# Patient Record
Sex: Male | Born: 1985 | Race: White | Hispanic: No | Marital: Single | Smoking: Current some day smoker
Health system: Southern US, Community
[De-identification: ages and names within clinical notes are randomized; demographics above are authoritative.]

## PROBLEM LIST (undated history)

## (undated) DIAGNOSIS — F410 Panic disorder [episodic paroxysmal anxiety] without agoraphobia: Secondary | ICD-10-CM

## (undated) DIAGNOSIS — F419 Anxiety disorder, unspecified: Secondary | ICD-10-CM

## (undated) DIAGNOSIS — F1010 Alcohol abuse, uncomplicated: Secondary | ICD-10-CM

## (undated) DIAGNOSIS — R569 Unspecified convulsions: Secondary | ICD-10-CM

---

## 2004-11-28 ENCOUNTER — Emergency Department: Payer: Self-pay | Admitting: Emergency Medicine

## 2005-04-29 ENCOUNTER — Other Ambulatory Visit: Payer: Self-pay

## 2005-04-29 ENCOUNTER — Emergency Department: Payer: Self-pay | Admitting: Emergency Medicine

## 2006-06-25 ENCOUNTER — Emergency Department: Payer: Self-pay | Admitting: Emergency Medicine

## 2008-08-26 ENCOUNTER — Emergency Department: Payer: Self-pay

## 2008-08-27 ENCOUNTER — Emergency Department: Payer: Self-pay | Admitting: Emergency Medicine

## 2009-04-22 ENCOUNTER — Emergency Department: Payer: Self-pay | Admitting: Emergency Medicine

## 2010-08-01 ENCOUNTER — Inpatient Hospital Stay: Payer: Self-pay | Admitting: Psychiatry

## 2011-02-20 ENCOUNTER — Emergency Department: Payer: Self-pay | Admitting: *Deleted

## 2011-10-23 ENCOUNTER — Emergency Department: Payer: Self-pay | Admitting: Emergency Medicine

## 2011-10-23 LAB — URINALYSIS, COMPLETE
Bacteria: NONE SEEN
Bilirubin,UR: NEGATIVE
Glucose,UR: NEGATIVE mg/dL (ref 0–75)
Leukocyte Esterase: NEGATIVE
Nitrite: NEGATIVE
Ph: 5 (ref 4.5–8.0)
Protein: NEGATIVE
RBC,UR: 6 /HPF (ref 0–5)
Squamous Epithelial: NONE SEEN

## 2011-10-23 LAB — COMPREHENSIVE METABOLIC PANEL
Albumin: 4.8 g/dL (ref 3.4–5.0)
Alkaline Phosphatase: 76 U/L (ref 50–136)
BUN: 11 mg/dL (ref 7–18)
Bilirubin,Total: 0.6 mg/dL (ref 0.2–1.0)
Co2: 33 mmol/L — ABNORMAL HIGH (ref 21–32)
EGFR (Non-African Amer.): >60
Osmolality: 279 (ref 275–301)
Potassium: 4.1 mmol/L (ref 3.5–5.1)
SGOT(AST): 28 U/L (ref 15–37)
Sodium: 140 mmol/L (ref 136–145)

## 2011-10-23 LAB — CBC
HCT: 50.2 % (ref 40.0–52.0)
HGB: 17.1 g/dL (ref 13.0–18.0)
MCHC: 34 g/dL (ref 32.0–36.0)
Platelet: 355 10*3/uL (ref 150–440)
RBC: 5.23 10*6/uL (ref 4.40–5.90)
WBC: 4.6 10*3/uL (ref 3.8–10.6)

## 2011-10-23 LAB — DRUG SCREEN, URINE
Amphetamines, Ur Screen: NEGATIVE (ref ?–1000)
Benzodiazepine, Ur Scrn: POSITIVE (ref ?–200)
MDMA (Ecstasy)Ur Screen: NEGATIVE (ref ?–500)
Methadone, Ur Screen: NEGATIVE (ref ?–300)
Opiate, Ur Screen: NEGATIVE (ref ?–300)
Tricyclic, Ur Screen: NEGATIVE (ref ?–1000)

## 2011-10-23 LAB — SALICYLATE LEVEL: Salicylates, Serum: <1.7 mg/dL

## 2011-10-23 LAB — TSH: Thyroid Stimulating Horm: 5.22 u[IU]/mL — ABNORMAL HIGH

## 2011-10-23 LAB — CK TOTAL AND CKMB (NOT AT ARMC)
CK, Total: 92 U/L (ref 35–232)
CK-MB: <0.5 ng/mL — ABNORMAL LOW (ref 0.5–3.6)

## 2011-10-23 LAB — TROPONIN I
Troponin-I: <0.02 ng/mL
Troponin-I: <0.02 ng/mL

## 2011-10-23 LAB — ETHANOL
Ethanol %: 0.251 % — ABNORMAL HIGH (ref 0.000–0.080)
Ethanol: 251 mg/dL

## 2011-12-24 ENCOUNTER — Inpatient Hospital Stay: Payer: Self-pay | Admitting: Psychiatry

## 2011-12-24 LAB — URINALYSIS, COMPLETE
Bacteria: NONE SEEN
Blood: NEGATIVE
Ketone: NEGATIVE
Leukocyte Esterase: NEGATIVE
Nitrite: NEGATIVE
Ph: 6 (ref 4.5–8.0)
Protein: NEGATIVE
Specific Gravity: 1.003 (ref 1.003–1.030)
WBC UR: NONE SEEN /HPF (ref 0–5)

## 2011-12-24 LAB — COMPREHENSIVE METABOLIC PANEL
Albumin: 4.7 g/dL (ref 3.4–5.0)
Alkaline Phosphatase: 86 U/L (ref 50–136)
Anion Gap: 10 (ref 7–16)
BUN: 6 mg/dL — ABNORMAL LOW (ref 7–18)
Co2: 26 mmol/L (ref 21–32)
Creatinine: 0.7 mg/dL (ref 0.60–1.30)
EGFR (Non-African Amer.): >60
Osmolality: 280 (ref 275–301)
Potassium: 3.7 mmol/L (ref 3.5–5.1)
SGOT(AST): 42 U/L — ABNORMAL HIGH (ref 15–37)
SGPT (ALT): 21 U/L
Total Protein: 8.6 g/dL — ABNORMAL HIGH (ref 6.4–8.2)

## 2011-12-24 LAB — DRUG SCREEN, URINE
Barbiturates, Ur Screen: NEGATIVE (ref ?–200)
Benzodiazepine, Ur Scrn: NEGATIVE (ref ?–200)
Cannabinoid 50 Ng, Ur ~~LOC~~: NEGATIVE (ref ?–50)
Cocaine Metabolite,Ur ~~LOC~~: NEGATIVE (ref ?–300)
Methadone, Ur Screen: NEGATIVE (ref ?–300)
Opiate, Ur Screen: NEGATIVE (ref ?–300)
Phencyclidine (PCP) Ur S: NEGATIVE (ref ?–25)
Tricyclic, Ur Screen: NEGATIVE (ref ?–1000)

## 2011-12-24 LAB — TSH: Thyroid Stimulating Horm: 2.51 u[IU]/mL

## 2011-12-24 LAB — CBC
MCH: 33.2 pg (ref 26.0–34.0)
MCHC: 35 g/dL (ref 32.0–36.0)
Platelet: 307 10*3/uL (ref 150–440)
RBC: 4.81 10*6/uL (ref 4.40–5.90)
WBC: 4.4 10*3/uL (ref 3.8–10.6)

## 2011-12-28 LAB — COMPREHENSIVE METABOLIC PANEL
Albumin: 4.3 g/dL (ref 3.4–5.0)
Anion Gap: 7 (ref 7–16)
BUN: 13 mg/dL (ref 7–18)
Bilirubin,Total: 1.4 mg/dL — ABNORMAL HIGH (ref 0.2–1.0)
Calcium, Total: 9.4 mg/dL (ref 8.5–10.1)
Co2: 30 mmol/L (ref 21–32)
EGFR (African American): >60
SGPT (ALT): 16 U/L (ref 12–78)
Sodium: 136 mmol/L (ref 136–145)
Total Protein: 8.1 g/dL (ref 6.4–8.2)

## 2012-03-08 ENCOUNTER — Emergency Department: Payer: Self-pay | Admitting: Emergency Medicine

## 2012-03-08 LAB — COMPREHENSIVE METABOLIC PANEL
Albumin: 4.2 g/dL (ref 3.4–5.0)
BUN: 10 mg/dL (ref 7–18)
Bilirubin,Total: 1 mg/dL (ref 0.2–1.0)
Chloride: 89 mmol/L — ABNORMAL LOW (ref 98–107)
Creatinine: 0.7 mg/dL (ref 0.60–1.30)
EGFR (African American): >60
Glucose: 127 mg/dL — ABNORMAL HIGH (ref 65–99)
Osmolality: 263 (ref 275–301)
Potassium: 2.7 mmol/L — ABNORMAL LOW (ref 3.5–5.1)
SGOT(AST): 38 U/L — ABNORMAL HIGH (ref 15–37)
SGPT (ALT): 25 U/L (ref 12–78)
Total Protein: 8.4 g/dL — ABNORMAL HIGH (ref 6.4–8.2)

## 2012-03-08 LAB — URINALYSIS, COMPLETE
Bilirubin,UR: NEGATIVE
Blood: NEGATIVE
Glucose,UR: NEGATIVE mg/dL (ref 0–75)
Hyaline Cast: 3
Ketone: NEGATIVE
Nitrite: NEGATIVE
Ph: 5 (ref 4.5–8.0)
Specific Gravity: 1.03 (ref 1.003–1.030)
Squamous Epithelial: NONE SEEN
WBC UR: 6 /HPF (ref 0–5)

## 2012-03-08 LAB — CBC
HGB: 14.1 g/dL (ref 13.0–18.0)
MCHC: 35.1 g/dL (ref 32.0–36.0)
MCV: 93 fL (ref 80–100)
RDW: 12.1 % (ref 11.5–14.5)
WBC: 8.4 10*3/uL (ref 3.8–10.6)

## 2012-06-07 ENCOUNTER — Emergency Department: Payer: Self-pay | Admitting: Emergency Medicine

## 2012-06-09 LAB — BETA STREP CULTURE(ARMC)

## 2012-11-17 ENCOUNTER — Emergency Department: Payer: Self-pay | Admitting: Emergency Medicine

## 2012-11-17 LAB — COMPREHENSIVE METABOLIC PANEL
Albumin: 5 g/dL (ref 3.4–5.0)
Calcium, Total: 9.2 mg/dL (ref 8.5–10.1)
Chloride: 102 mmol/L (ref 98–107)
EGFR (Non-African Amer.): >60
Glucose: 97 mg/dL (ref 65–99)
Osmolality: 277 (ref 275–301)
SGOT(AST): 38 U/L — ABNORMAL HIGH (ref 15–37)
SGPT (ALT): 25 U/L (ref 12–78)
Sodium: 140 mmol/L (ref 136–145)
Total Protein: 9.3 g/dL — ABNORMAL HIGH (ref 6.4–8.2)

## 2012-11-17 LAB — CBC
HGB: 16 g/dL (ref 13.0–18.0)
MCH: 33.1 pg (ref 26.0–34.0)
MCV: 97 fL (ref 80–100)
Platelet: 396 10*3/uL (ref 150–440)
RBC: 4.83 10*6/uL (ref 4.40–5.90)
WBC: 5.7 10*3/uL (ref 3.8–10.6)

## 2012-11-17 LAB — DRUG SCREEN, URINE
Amphetamines, Ur Screen: NEGATIVE (ref ?–1000)
Benzodiazepine, Ur Scrn: NEGATIVE (ref ?–200)
Cannabinoid 50 Ng, Ur ~~LOC~~: NEGATIVE (ref ?–50)
Cocaine Metabolite,Ur ~~LOC~~: NEGATIVE (ref ?–300)
Opiate, Ur Screen: NEGATIVE (ref ?–300)
Phencyclidine (PCP) Ur S: NEGATIVE (ref ?–25)
Tricyclic, Ur Screen: NEGATIVE (ref ?–1000)

## 2012-11-17 LAB — SALICYLATE LEVEL: Salicylates, Serum: <1.7 mg/dL

## 2013-01-20 ENCOUNTER — Emergency Department: Payer: Self-pay | Admitting: Emergency Medicine

## 2013-01-20 LAB — URINALYSIS, COMPLETE
Bacteria: NONE SEEN
Bilirubin,UR: NEGATIVE
Glucose,UR: NEGATIVE mg/dL (ref 0–75)
Ketone: NEGATIVE
Leukocyte Esterase: NEGATIVE
Nitrite: NEGATIVE
Ph: 5 (ref 4.5–8.0)
Protein: NEGATIVE
Specific Gravity: 1.006 (ref 1.003–1.030)
WBC UR: NONE SEEN /HPF (ref 0–5)

## 2013-01-20 LAB — COMPREHENSIVE METABOLIC PANEL
Albumin: 4.5 g/dL (ref 3.4–5.0)
BUN: 7 mg/dL (ref 7–18)
Bilirubin,Total: 0.2 mg/dL (ref 0.2–1.0)
Chloride: 110 mmol/L — ABNORMAL HIGH (ref 98–107)
Co2: 26 mmol/L (ref 21–32)
EGFR (African American): >60
Glucose: 97 mg/dL (ref 65–99)
Osmolality: 283 (ref 275–301)
Sodium: 143 mmol/L (ref 136–145)

## 2013-01-20 LAB — DRUG SCREEN, URINE
Amphetamines, Ur Screen: NEGATIVE (ref ?–1000)
Barbiturates, Ur Screen: NEGATIVE (ref ?–200)
Benzodiazepine, Ur Scrn: NEGATIVE (ref ?–200)
Cannabinoid 50 Ng, Ur ~~LOC~~: NEGATIVE (ref ?–50)
Methadone, Ur Screen: NEGATIVE (ref ?–300)
Tricyclic, Ur Screen: NEGATIVE (ref ?–1000)

## 2013-01-20 LAB — CBC
HCT: 44.2 % (ref 40.0–52.0)
HGB: 15.3 g/dL (ref 13.0–18.0)
RBC: 4.59 10*6/uL (ref 4.40–5.90)
RDW: 13.3 % (ref 11.5–14.5)
WBC: 6.6 10*3/uL (ref 3.8–10.6)

## 2013-01-20 LAB — ETHANOL: Ethanol %: 0.39 % (ref 0.000–0.080)

## 2013-01-21 LAB — CBC
HGB: 14.4 g/dL (ref 13.0–18.0)
MCHC: 34.7 g/dL (ref 32.0–36.0)
RDW: 13.4 % (ref 11.5–14.5)
WBC: 5.8 10*3/uL (ref 3.8–10.6)

## 2013-01-21 LAB — COMPREHENSIVE METABOLIC PANEL
Alkaline Phosphatase: 67 U/L (ref 50–136)
BUN: 9 mg/dL (ref 7–18)
Bilirubin,Total: 0.3 mg/dL (ref 0.2–1.0)
Co2: 26 mmol/L (ref 21–32)
Creatinine: 0.96 mg/dL (ref 0.60–1.30)
EGFR (African American): >60
Glucose: 95 mg/dL (ref 65–99)
SGOT(AST): 25 U/L (ref 15–37)
SGPT (ALT): 20 U/L (ref 12–78)
Sodium: 142 mmol/L (ref 136–145)
Total Protein: 8.1 g/dL (ref 6.4–8.2)

## 2013-01-21 LAB — TSH: Thyroid Stimulating Horm: 1.39 u[IU]/mL

## 2013-01-21 LAB — ETHANOL: Ethanol: 428 mg/dL

## 2013-01-22 ENCOUNTER — Inpatient Hospital Stay: Payer: Self-pay | Admitting: Psychiatry

## 2013-01-22 LAB — URINALYSIS, COMPLETE
Bacteria: NONE SEEN
Bilirubin,UR: NEGATIVE
Glucose,UR: NEGATIVE mg/dL (ref 0–75)
Ketone: NEGATIVE
Leukocyte Esterase: NEGATIVE
Ph: 5 (ref 4.5–8.0)
RBC,UR: NONE SEEN /HPF (ref 0–5)
Squamous Epithelial: NONE SEEN
WBC UR: 1 /HPF (ref 0–5)

## 2013-01-22 LAB — DRUG SCREEN, URINE
Amphetamines, Ur Screen: NEGATIVE (ref ?–1000)
Barbiturates, Ur Screen: NEGATIVE (ref ?–200)
Benzodiazepine, Ur Scrn: NEGATIVE (ref ?–200)
Cannabinoid 50 Ng, Ur ~~LOC~~: NEGATIVE (ref ?–50)
MDMA (Ecstasy)Ur Screen: NEGATIVE (ref ?–500)
Methadone, Ur Screen: NEGATIVE (ref ?–300)
Opiate, Ur Screen: NEGATIVE (ref ?–300)
Phencyclidine (PCP) Ur S: NEGATIVE (ref ?–25)
Tricyclic, Ur Screen: NEGATIVE (ref ?–1000)

## 2013-01-25 ENCOUNTER — Emergency Department: Payer: Self-pay | Admitting: Emergency Medicine

## 2013-01-25 LAB — COMPREHENSIVE METABOLIC PANEL
Albumin: 4.7 g/dL (ref 3.4–5.0)
Alkaline Phosphatase: 62 U/L (ref 50–136)
Anion Gap: 5 — ABNORMAL LOW (ref 7–16)
Bilirubin,Total: 0.5 mg/dL (ref 0.2–1.0)
Co2: 28 mmol/L (ref 21–32)
Creatinine: 0.73 mg/dL (ref 0.60–1.30)
EGFR (African American): >60
Glucose: 82 mg/dL (ref 65–99)
Osmolality: 265 (ref 275–301)
Potassium: 4.3 mmol/L (ref 3.5–5.1)
SGOT(AST): 24 U/L (ref 15–37)
Total Protein: 8.2 g/dL (ref 6.4–8.2)

## 2013-01-25 LAB — URINALYSIS, COMPLETE
Bilirubin,UR: NEGATIVE
Blood: NEGATIVE
Glucose,UR: NEGATIVE mg/dL (ref 0–75)
Ketone: NEGATIVE
Leukocyte Esterase: NEGATIVE
Nitrite: NEGATIVE
Ph: 6 (ref 4.5–8.0)
Protein: NEGATIVE
Squamous Epithelial: NONE SEEN
WBC UR: NONE SEEN /HPF (ref 0–5)

## 2013-01-25 LAB — DRUG SCREEN, URINE
Amphetamines, Ur Screen: NEGATIVE (ref ?–1000)
Barbiturates, Ur Screen: NEGATIVE (ref ?–200)
Benzodiazepine, Ur Scrn: NEGATIVE (ref ?–200)
Cannabinoid 50 Ng, Ur ~~LOC~~: NEGATIVE (ref ?–50)
Cocaine Metabolite,Ur ~~LOC~~: NEGATIVE (ref ?–300)
Methadone, Ur Screen: NEGATIVE (ref ?–300)
Opiate, Ur Screen: NEGATIVE (ref ?–300)
Phencyclidine (PCP) Ur S: NEGATIVE (ref ?–25)

## 2013-01-25 LAB — CBC
HCT: 42.1 % (ref 40.0–52.0)
MCHC: 34.7 g/dL (ref 32.0–36.0)
MCV: 97 fL (ref 80–100)
Platelet: 216 10*3/uL (ref 150–440)

## 2013-01-25 LAB — ETHANOL
Ethanol %: <0.003 % (ref 0.000–0.080)
Ethanol: <3 mg/dL

## 2013-01-29 ENCOUNTER — Emergency Department: Payer: Self-pay | Admitting: Emergency Medicine

## 2013-11-19 ENCOUNTER — Emergency Department: Payer: Self-pay | Admitting: Emergency Medicine

## 2013-11-19 LAB — URINALYSIS, COMPLETE
BACTERIA: NONE SEEN
Bilirubin,UR: NEGATIVE
Blood: NEGATIVE
GLUCOSE, UR: NEGATIVE mg/dL (ref 0–75)
Ketone: NEGATIVE
LEUKOCYTE ESTERASE: NEGATIVE
Nitrite: NEGATIVE
PH: 6 (ref 4.5–8.0)
PROTEIN: NEGATIVE
RBC, UR: NONE SEEN /HPF (ref 0–5)
SPECIFIC GRAVITY: 1.014 (ref 1.003–1.030)
SQUAMOUS EPITHELIAL: NONE SEEN
WBC UR: 1 /HPF (ref 0–5)

## 2013-11-19 LAB — CBC
HCT: 44.2 % (ref 40.0–52.0)
HGB: 14.9 g/dL (ref 13.0–18.0)
MCH: 33.1 pg (ref 26.0–34.0)
MCHC: 33.7 g/dL (ref 32.0–36.0)
MCV: 98 fL (ref 80–100)
PLATELETS: 157 10*3/uL (ref 150–440)
RBC: 4.5 10*6/uL (ref 4.40–5.90)
RDW: 13.7 % (ref 11.5–14.5)
WBC: 6.2 10*3/uL (ref 3.8–10.6)

## 2013-11-19 LAB — COMPREHENSIVE METABOLIC PANEL
ALK PHOS: 59 U/L
ANION GAP: 10 (ref 7–16)
AST: 48 U/L — AB (ref 15–37)
Albumin: 4.6 g/dL (ref 3.4–5.0)
BUN: 5 mg/dL — ABNORMAL LOW (ref 7–18)
Bilirubin,Total: 0.5 mg/dL (ref 0.2–1.0)
CALCIUM: 9.8 mg/dL (ref 8.5–10.1)
CHLORIDE: 99 mmol/L (ref 98–107)
CO2: 29 mmol/L (ref 21–32)
CREATININE: 0.68 mg/dL (ref 0.60–1.30)
EGFR (African American): >60
GLUCOSE: 108 mg/dL — AB (ref 65–99)
OSMOLALITY: 273 (ref 275–301)
POTASSIUM: 3.8 mmol/L (ref 3.5–5.1)
SGPT (ALT): 43 U/L (ref 12–78)
SODIUM: 138 mmol/L (ref 136–145)
Total Protein: 8.2 g/dL (ref 6.4–8.2)

## 2013-11-19 LAB — ACETAMINOPHEN LEVEL

## 2013-11-19 LAB — DRUG SCREEN, URINE

## 2013-11-19 LAB — SALICYLATE LEVEL: Salicylates, Serum: <1.7 mg/dL

## 2013-11-19 LAB — ETHANOL: Ethanol %: <0.003 % (ref 0.000–0.080)

## 2013-11-19 LAB — TSH: Thyroid Stimulating Horm: 2.59 u[IU]/mL

## 2014-08-22 ENCOUNTER — Emergency Department
Admit: 2014-08-22 | Disposition: A | Discharge status: Home / Self Care | Payer: Self-pay | Admitting: Emergency Medicine

## 2014-08-23 ENCOUNTER — Emergency Department
Admit: 2014-08-23 | Disposition: A | Discharge status: Home / Self Care | Payer: Self-pay | Admitting: Emergency Medicine

## 2014-08-23 LAB — CBC WITH DIFFERENTIAL/PLATELET
BASOS ABS: 0 10*3/uL (ref 0.0–0.1)
Basophil %: 0.6 %
Eosinophil #: 0.1 10*3/uL (ref 0.0–0.7)
Eosinophil %: 1 %
HCT: 43.8 % (ref 40.0–52.0)
HGB: 14.8 g/dL (ref 13.0–18.0)
LYMPHS ABS: 1 10*3/uL (ref 1.0–3.6)
Lymphocyte %: 13.1 %
MCH: 33.2 pg (ref 26.0–34.0)
MCHC: 33.7 g/dL (ref 32.0–36.0)
MCV: 99 fL (ref 80–100)
MONO ABS: 1 x10 3/mm (ref 0.2–1.0)
MONOS PCT: 13.7 %
NEUTROS ABS: 5.4 10*3/uL (ref 1.4–6.5)
Neutrophil %: 71.6 %
PLATELETS: 258 10*3/uL (ref 150–440)
RBC: 4.45 10*6/uL (ref 4.40–5.90)
RDW: 12.8 % (ref 11.5–14.5)
WBC: 7.5 10*3/uL (ref 3.8–10.6)
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
## 2014-09-13 NOTE — Consult Note (Signed)
Consult: Substance Abuse treatment recommendations  Met with patient as requested and explored with him his expressed desire of treatment in a treatment Center in [facility], [place] at this discharge. He was able to disclose events that led to his IVC and the need for treatment at this level of care to include past history of relapse, conflict between his girlfriend and him as well as his medical necessity due to his consuming alcoholic beverage in addition to his demonstrating resistance to treatment which required the IVC due to the need of detox thus this level of Care, however he also indicated that if his desired treatment in [facility] was a long term program he does not want to go. Patient may benefit from treatment at a [place] however; it is possible that he may not go on his own volition. He expressed desires for treatment however he also expressed desires of not going to treatment. Findings were reviewed with patient as well as attending psychiatrist.   Electronic Signatures: Laqueta Due (PsyD)  (Signed on 02-Sep-14 15:28)  Authored  Last Updated: 02-Sep-14 15:28 by Laqueta Due (PsyD)

## 2014-09-13 NOTE — Consult Note (Signed)
Brief Consult Note: Diagnosis: Alcohol dependence.   Patient was seen by consultant.   Consult note dictated.   Recommend further assessment or treatment.   Orders entered.   Comments: Will admit to BMU.  Electronic Signatures: Kristine LineaPucilowska, Catharina Pica (MD)  (Signed 01-Sep-14 13:53)  Authored: Brief Consult Note   Last Updated: 01-Sep-14 13:53 by Kristine LineaPucilowska, Lisset Ketchem (MD)

## 2014-10-01 NOTE — H&P (Signed)
PATIENT NAME:  Micheal Micheal [name], Micheal Micheal [name] [id number] OF BIRTH:  01/11/1986 OF ADMISSION:  12/22/2012  REFERRING PHYSICIAN: Emergency Room MD PHYSICIAN: Kristine LineaJolanta Pucilowska, MD   DATA: Micheal Micheal [name] is Micheal [name] 29 year old male with history of alcoholism.   COMPLAINT: "I need treatment."   OF PRESENT ILLNESS: Micheal Micheal [name] has Micheal [name] long history of alcoholism. He was admitted to [facility] exactly 1 year ago for detox. At that time, he was not interested in alcohol rehab and was discharged to IOP program. He did not follow up. He did not take prescribed medications or maintain sobriety. He has been drinking heavily for the past several months. He started stealing from his family. His girlfriend kicked him out of the house. He has been to the ER on 27th and 30th of August but left AMA. He returns again on August 31 asking for detox and long term treatment at [facility]. He refuses to go to [facility] but they do not have any beds available. He has been consuming 3 40 oz beers and Micheal Micheal [name] daily to the BAL of 428. The patient denies suicidal ideation. He does not have Micheal [name] history of violence. He denies psychotic symptoms or symptoms suggestive of bipolar mania. There are no other than alcohol substance use.   PSYCHIATRIC HISTORY: He was hospitalized at [facility] in March of 2012 and in August of 2013 for treatment of bipolar disorder and alcohol dependence. There is one suicide attempt/gesture with Micheal [name] gun while drunk. He made suicidal threats in the past. In spite of diagnosis of bipolar illness, the patient has not been compliant with offered medications. PSYCHIATRIC HISTORY: None reported.  MEDICAL HISTORY: History of alcohol withdrawal seizures   ALLERGIES: No known drug allergies.  ON ADMISSION: None.  HISTORY: [place] was born [place], Dropped out in the 9th grade to go to work. He has never been married and has no children. He used to live with Micheal [name] steady girlfriend and her child. She will not have him  anymore. He has not been employed for many years now. He has no health insurance.   REVIEW OF SYSTEMS:  CONSTITUTIONAL: No fevers or chills. Positive for some weight loss.  No double or blurred vision. No hearing loss. No shortness of breath or cough. No chest pain or orthopnea. No abdominal pain, nausea, vomiting or diarrhea. No incontinence or frequency. No heat or cold intolerance. No anemia or easy bruising. No acne or rash. No muscle or joint pain. No tingling or weakness. See history of present illness for details.  EXAMINATION: VITAL SIGNS: Blood pressure 136/84, pulse 82, respirations 20, temperature 98.2. GENERAL: This is Micheal [name] slender man in no acute distress. HEENT: The pupils are equal, round, and reactive to light. Sclerae anicteric. NECK: Supple. No thyromegaly. LUNGS: Clear to auscultation. No dullness to percussion. HEART: Regular rhythm and rate. No murmurs, rubs, or gallops. ABDOMEN: Soft, nontender, nondistended. Positive bowel sounds. MUSCULOSKELETAL: Normal muscle strength in all extremities. SKIN: No rashes or bruises. LYMPHATIC: No cervical adenopathy. NEUROLOGIC: Cranial nerves II through XII are intact.  DATA: Chemistries are within normal limits except for blood alcohol level of 0.428. LFTs within normal limits. TSH 2.39. Urine tox screen negative for substances. CBC within normal limits. Urinalysis not suggestive of urinary tract infection.   MENTAL STATUS EXAMINATION ON ADMISSION: The patient is alert and fully oriented. He is pleasant, polite and cooperative. He is well groomed, wearing hospital scrubs. She maintains some eye contact. There is poverty of speech. Mood  is "bad" with anxious affect. Thought process is logical. He denies suicidal or homicidal ideation. There are no delusions, paranoia or hallucinations. His cognition is grossly intact. His insight and judgment are questionable.  SUICIDE RISK ASSESSMENT ON ADMISSION: This is Micheal [name] patient with Micheal [name] long history of mood  instability, depression, and alcoholism who is drinking out of control, does not take medications and has severe stressors. He is at increased risk for suicide.   DIAGNOSES: I:  Alcohol dependence.    Bipolar affective disorder per history. II:  Personality disorder, not otherwise specified. III:  History of alcohol withdrawal seizures. IV:  Mental illness, substance abuse, occupational, financial, primary support, relationship. V:  GAF on admission 25.  The patient was admitted to [facility] for safety, stabilization, and medication management. He was initially placed on suicide precautions and was closely monitored for any unsafe behaviors. He underwent full psychiatric and risk assessment. He received pharmacotherapy, individual and group psychotherapy, substance abuse counseling, and support from therapeutic milieu.  1. Alcohol detox: The patient is on CIWA protocol.    2. Mood. In the past, the patient did well on Micheal [name] combination of Celexa and Depakote. He is not interested on pharmacotherapy.  He only wants clonazpam.  Alcohol dependence. He is interested in long term program.    Disposition. TBE.    Electronic Signatures: Kristine LineaPucilowska, Jolanta (MD)  (Signed on 01-Sep-14 18:34)  Authored  Last Updated: 01-Sep-14 18:34 by Kristine LineaPucilowska, Jolanta (MD)

## 2015-07-15 ENCOUNTER — Emergency Department
Admission: EM | Admit: 2015-07-15 | Discharge: 2015-07-15 | Disposition: A | Discharge status: Home / Self Care | Payer: Self-pay | Attending: Emergency Medicine | Admitting: Emergency Medicine

## 2015-07-15 ENCOUNTER — Encounter: Payer: Self-pay | Admitting: Emergency Medicine

## 2015-07-15 DIAGNOSIS — F17210 Nicotine dependence, cigarettes, uncomplicated: Secondary | ICD-10-CM | POA: Insufficient documentation

## 2015-07-15 DIAGNOSIS — R1110 Vomiting, unspecified: Secondary | ICD-10-CM | POA: Insufficient documentation

## 2015-07-15 DIAGNOSIS — R109 Unspecified abdominal pain: Secondary | ICD-10-CM | POA: Insufficient documentation

## 2015-07-15 HISTORY — DX: Panic disorder (episodic paroxysmal anxiety): F41.0

## 2015-07-15 HISTORY — DX: Unspecified convulsions: R56.9

## 2015-07-15 HISTORY — DX: Alcohol abuse, uncomplicated: F10.10

## 2015-07-15 HISTORY — DX: Anxiety disorder, unspecified: F41.9

## 2015-07-15 NOTE — ED Notes (Signed)
Consumed an excessive amount of liquor a week ago, a lot of vomiting since then and is now c/o severe pain with swallowing to the point that it is causing difficulty with eating and states he has lost 15 pounds.  Pain is worse at the top of his throat and radiates to his stomach, especially when eating or drinking.  States he has been using a lot of BC powders over the past week, around 10 a day.  Also using chloraseptic spray a lot.

## 2015-07-27 ENCOUNTER — Encounter: Payer: Self-pay | Admitting: Emergency Medicine

## 2015-07-27 ENCOUNTER — Emergency Department
Admission: EM | Admit: 2015-07-27 | Discharge: 2015-07-28 | Disposition: A | Discharge status: Home / Self Care | Payer: Self-pay | Attending: Emergency Medicine | Admitting: Emergency Medicine

## 2015-07-27 DIAGNOSIS — F1210 Cannabis abuse, uncomplicated: Secondary | ICD-10-CM | POA: Insufficient documentation

## 2015-07-27 DIAGNOSIS — F410 Panic disorder [episodic paroxysmal anxiety] without agoraphobia: Secondary | ICD-10-CM | POA: Insufficient documentation

## 2015-07-27 DIAGNOSIS — F17210 Nicotine dependence, cigarettes, uncomplicated: Secondary | ICD-10-CM | POA: Insufficient documentation

## 2015-07-27 DIAGNOSIS — R000 Tachycardia, unspecified: Secondary | ICD-10-CM | POA: Insufficient documentation

## 2015-07-27 DIAGNOSIS — F1010 Alcohol abuse, uncomplicated: Secondary | ICD-10-CM | POA: Insufficient documentation

## 2015-07-27 LAB — COMPREHENSIVE METABOLIC PANEL
ALBUMIN: 5 g/dL (ref 3.5–5.0)
ALK PHOS: 64 U/L (ref 38–126)
ALT: 23 U/L (ref 17–63)
ANION GAP: 11 (ref 5–15)
AST: 41 U/L (ref 15–41)
BILIRUBIN TOTAL: 0.6 mg/dL (ref 0.3–1.2)
BUN: 7 mg/dL (ref 6–20)
CALCIUM: 9.3 mg/dL (ref 8.9–10.3)
CO2: 33 mmol/L — ABNORMAL HIGH (ref 22–32)
Chloride: 100 mmol/L — ABNORMAL LOW (ref 101–111)
Creatinine, Ser: 0.67 mg/dL (ref 0.61–1.24)
GFR calc non Af Amer: >60 mL/min (ref >60)
GLUCOSE: 108 mg/dL — AB (ref 65–99)
POTASSIUM: 3.3 mmol/L — AB (ref 3.5–5.1)
Sodium: 144 mmol/L (ref 135–145)
TOTAL PROTEIN: 8.3 g/dL — AB (ref 6.5–8.1)

## 2015-07-27 LAB — CBC
HEMATOCRIT: 44.2 % (ref 40.0–52.0)
HEMOGLOBIN: 15 g/dL (ref 13.0–18.0)
MCH: 31.4 pg (ref 26.0–34.0)
MCHC: 34 g/dL (ref 32.0–36.0)
MCV: 92.3 fL (ref 80.0–100.0)
Platelets: 349 10*3/uL (ref 150–440)
RBC: 4.79 MIL/uL (ref 4.40–5.90)
RDW: 14.5 % (ref 11.5–14.5)
WBC: 5.2 10*3/uL (ref 3.8–10.6)

## 2015-07-27 LAB — URINE DRUG SCREEN, QUALITATIVE (ARMC ONLY)
AMPHETAMINES, UR SCREEN: NOT DETECTED
BARBITURATES, UR SCREEN: NOT DETECTED
BENZODIAZEPINE, UR SCRN: NOT DETECTED
Cannabinoid 50 Ng, Ur ~~LOC~~: POSITIVE — AB
Cocaine Metabolite,Ur ~~LOC~~: NOT DETECTED
MDMA (Ecstasy)Ur Screen: NOT DETECTED
METHADONE SCREEN, URINE: NOT DETECTED
OPIATE, UR SCREEN: NOT DETECTED
Phencyclidine (PCP) Ur S: NOT DETECTED
TRICYCLIC, UR SCREEN: NOT DETECTED

## 2015-07-27 LAB — ETHANOL: ALCOHOL ETHYL (B): 434 mg/dL — AB (ref <5)

## 2015-07-27 MED ORDER — SODIUM CHLORIDE 0.9 % IV BOLUS (SEPSIS)
1000.0000 mL | Freq: Once | INTRAVENOUS | Status: AC
  Administered 2015-07-27: 1000 mL via INTRAVENOUS

## 2015-07-27 MED ORDER — THIAMINE HCL 100 MG/ML IJ SOLN
Freq: Once | INTRAVENOUS | Status: AC
  Administered 2015-07-28: via INTRAVENOUS
  Filled 2015-07-27: qty 1000

## 2015-07-27 MED ORDER — LORAZEPAM 2 MG/ML IJ SOLN
0.0000 mg | Freq: Four times a day (QID) | INTRAMUSCULAR | Status: DC
  Administered 2015-07-28: 2 mg via INTRAVENOUS

## 2015-07-27 MED ORDER — THIAMINE HCL 100 MG/ML IJ SOLN: 100.0000 mg | Freq: Every day | INTRAMUSCULAR | Status: DC

## 2015-07-27 MED ORDER — LORAZEPAM 2 MG PO TABS
0.0000 mg | ORAL_TABLET | Freq: Four times a day (QID) | ORAL | Status: DC
  Administered 2015-07-28: 2 mg via ORAL
  Filled 2015-07-27: qty 1

## 2015-07-27 MED ORDER — LORAZEPAM 2 MG/ML IJ SOLN
INTRAMUSCULAR | Status: AC
  Administered 2015-07-27: 2 mg via INTRAVENOUS
  Filled 2015-07-27: qty 1

## 2015-07-27 MED ORDER — LORAZEPAM 2 MG/ML IJ SOLN: 0.0000 mg | Freq: Two times a day (BID) | INTRAMUSCULAR | Status: DC

## 2015-07-27 MED ORDER — VITAMIN B-1 100 MG PO TABS: 100.0000 mg | ORAL_TABLET | Freq: Every day | ORAL | Status: DC

## 2015-07-27 MED ORDER — LORAZEPAM 2 MG/ML IJ SOLN
2.0000 mg | Freq: Once | INTRAMUSCULAR | Status: AC
  Administered 2015-07-27: 2 mg via INTRAVENOUS

## 2015-07-27 MED ORDER — LORAZEPAM 2 MG PO TABS
0.0000 mg | ORAL_TABLET | Freq: Two times a day (BID) | ORAL | Status: DC
  Administered 2015-07-28: 2 mg via ORAL
  Filled 2015-07-27: qty 1

## 2015-07-27 NOTE — ED Provider Notes (Signed)
[facility] Emergency Department Provider Note  ____________________________________________    I have reviewed the triage vital signs and the nursing notes.   HISTORY  Chief Complaint Alcohol Problem    HPI Micheal [name] is a 30 y.o. male who requests alcohol detox. He states that he is withdrawing. However he reports he recently drank prior to arrival. He notes that he drinks daily typically a half gallon of liquor but apparently over the last 4 days he has been drinking more heavily than usual. He denies drug abuse     Past Medical History  Diagnosis Date  . Seizures (HCC)   . ETOH abuse   . Anxiety   . Panic disorder     There are no active problems to display for this patient.   History reviewed. No pertinent past surgical history.  No current outpatient prescriptions on file.  Allergies Review of patient's allergies indicates no known allergies.  History reviewed. No pertinent family history.  Social History Social History  Substance Use Topics  . Smoking status: Current Some Day Smoker    Types: Cigarettes  . Smokeless tobacco: None  . Alcohol Use: Yes     Comment: intermittent binge drinks, hx of etoh withdraw with seizures    Review of Systems  Constitutional: Negative for dizziness Eyes: Negative for visual changes. ENT: Negative for sore throat Cardiovascular: Negative for chest pain. Positive for palpitations Respiratory: Negative for cough Gastrointestinal: Negative for abdominal pain Genitourinary: Negative for dysuria. Musculoskeletal: Negative for back pain. Skin: Negative for rash. Neurological: Negative for headaches  Psychiatric positive for anxiety    ____________________________________________   PHYSICAL EXAM:  VITAL SIGNS: ED Triage Vitals  Enc Vitals Group     BP 07/27/15 1721 138/91 mmHg     Pulse Rate 07/27/15 1721 135     Resp 07/27/15 1721 20     Temp 07/27/15 1721 97.9 F (36.6 C)      Temp Source 07/27/15 1721 Oral     SpO2 07/27/15 1721 99 %     Weight 07/27/15 1721 150 lb (68.04 kg)     Height 07/27/15 1721 6\' 4"  (1.93 m)     Head Cir --      Peak Flow --      Pain Score --      Pain Loc --      Pain Edu? --      Excl. in GC? --      Constitutional: Alert and oriented. No acute distress, anxious Eyes: Conjunctivae are normal.  ENT   Head: Normocephalic and atraumatic.   Mouth/Throat: Mucous membranes are moist. Cardiovascular: Tachycardic, regular rhythm. Normal and symmetric distal pulses are present in all extremities. Respiratory: Normal respiratory effort without tachypnea nor retractions.  Gastrointestinal: Soft and non-tender in all quadrants. No distention. Genitourinary: deferred Musculoskeletal: Nontender with normal range of motion in all extremities.  Neurologic:  Normal speech and language. No gross focal neurologic deficits are appreciated. Skin:  Skin is warm, dry and intact. No rash noted. Psychiatric: Mood and affect are normal. Patient exhibits appropriate insight and judgment.  ____________________________________________    LABS (pertinent positives/negatives)  Labs Reviewed  COMPREHENSIVE METABOLIC PANEL - Abnormal; Notable for the following:    Potassium 3.3 (*)    Chloride 100 (*)    CO2 33 (*)    Glucose, Bld 108 (*)    Total Protein 8.3 (*)    All other components within normal limits  ETHANOL - Abnormal; Notable for  the following:    Alcohol, Ethyl (B) 434 (*)    All other components within normal limits  URINE DRUG SCREEN, QUALITATIVE ([facility] ONLY) - Abnormal; Notable for the following:    Cannabinoid 50 Ng, Ur [place] POSITIVE (*)    All other components within normal limits  CBC    ____________________________________________   EKG  None  ____________________________________________    RADIOLOGY I have personally reviewed any xrays that were ordered on this  patient: None  ____________________________________________   PROCEDURES  Procedure(s) performed: none  Critical Care performed: none  ____________________________________________   INITIAL IMPRESSION / ASSESSMENT AND PLAN / ED COURSE  Pertinent labs & imaging results that were available during my care of the patient were reviewed by me and considered in my medical decision making (see chart for details).  CIWA protocol started, and TTS consulted for [facility] placement  Patient's alcohol level is too high to go to alcohol detox apparently  ____________________________________________   FINAL CLINICAL IMPRESSION(S) / ED DIAGNOSES  Final diagnoses:  Alcohol abuse     Jene Every, MD 07/27/15 2134

## 2015-07-27 NOTE — ED Notes (Signed)
Pt becoming increasingly agitated stating "I'm going through detox and ya'll ain't doing nothing for it" this tech explained to pt that he would need to wait and consult with a ER doctor before he could be given anything for the agitation. RN (terry) notified

## 2015-07-27 NOTE — ED Notes (Signed)
Pt here for detox from alcohol - He is requesting to be sent to a rehab facility for detox - His last drink was 5 minutes prior to arriving at the ER today

## 2015-07-27 NOTE — ED Notes (Signed)
Pt requesting to leave, Micheal [name], TTS with pt at this time, pt agitated father sent back to waiting room, pt not oriented to procedure

## 2015-07-27 NOTE — BH Assessment (Signed)
20:40-Patient information(Labs, Demographics, Triage Note) faxed to [facility] for possible admission and confirmed it was received ([phone]).  Talked with patient due to wanting to "I go home, take a shower and sleep in my bed..." Writer and his nurse Altamease Oiler(Noelle) informed him the medical risk of leaving due to his BAC being in 400's also discussed with him, he will loose his opportunity for a bed if he was to leave.  Patient agreed to stay.  06:52-Writer faxed updated labs & HP to RTS and confirmed it was received ([phone]). RTS will call back after reviewing it. "It will be after shift change."  Writer updated patient's nurse Altamease Oiler(Noelle).

## 2015-07-27 NOTE — ED Notes (Signed)
Pt would like detox from alcohol.  Reports being alcoholic but has been heavily drinking last 4 days.  Has been drinking half gallon of liquor for past 4 days.  Last drink was 10 min ago.

## 2015-07-27 NOTE — Progress Notes (Signed)
Per Dr. Rise PaganiniKinners' request TTS has consulted with the pt and explored detox and recovery options. Pt has confirmed his interest in being referred to [facility]. TTS has confirmed that there is a bed available. TTS as made pts nurse aware that the pt will need a BMP as well as an H&P. TTS will fax referral information to [facility] when it becomes available.    07/27/2015 Cheryl FlashNicole Colbert Curenton, MS, NCC, LPCA Therapeutic Triage Specialist

## 2015-07-27 NOTE — ED Notes (Signed)
ETOH 434, Paula, Lab, DR MelroseKINNER, orders rx'd

## 2015-07-27 NOTE — ED Notes (Signed)
Pharm called

## 2015-07-28 LAB — ETHANOL: ALCOHOL ETHYL (B): 104 mg/dL — AB (ref <5)

## 2015-07-28 MED ORDER — FAMOTIDINE 20 MG PO TABS
20.0000 mg | ORAL_TABLET | Freq: Two times a day (BID) | ORAL | Status: DC
  Administered 2015-07-28: 20 mg via ORAL
  Filled 2015-07-28: qty 1

## 2015-07-28 MED ORDER — LORAZEPAM 2 MG/ML IJ SOLN
INTRAMUSCULAR | Status: AC
  Administered 2015-07-28: 2 mg via INTRAVENOUS
  Filled 2015-07-28: qty 1

## 2015-07-28 NOTE — ED Notes (Signed)
Calvin, TTS contacted for ETOH results, sending results to [facility]

## 2015-07-28 NOTE — Discharge Instructions (Signed)
Please return to the emergency department if you develop thoughts of hurting yourself or anyone else, hallucinations, seizures, or any other symptoms concerning to you. Alcohol Use Disorder Alcohol use disorder is a mental disorder. It is not a one-time incident of heavy drinking. Alcohol use disorder is the excessive and uncontrollable use of alcohol over time that leads to problems with functioning in one or more areas of daily living. People with this disorder risk harming themselves and others when they drink to excess. Alcohol use disorder also can cause other mental disorders, such as mood and anxiety disorders, and serious physical problems. People with alcohol use disorder often misuse other drugs.  Alcohol use disorder is common and widespread. Some people with this disorder drink alcohol to cope with or escape from negative life events. Others drink to relieve chronic pain or symptoms of mental illness. People with a family history of alcohol use disorder are at higher risk of losing control and using alcohol to excess.  Drinking too much alcohol can cause injury, accidents, and health problems. One drink can be too much when you are:  Working.  Pregnant or breastfeeding.  Taking medicines. Ask your doctor.  Driving or planning to drive. SYMPTOMS  Signs and symptoms of alcohol use disorder may include the following:   Consumption ofalcohol inlarger amounts or over a longer period of time than intended.  Multiple unsuccessful attempts to cutdown or control alcohol use.   A great deal of time spent obtaining alcohol, using alcohol, or recovering from the effects of alcohol (hangover).  A strong desire or urge to use alcohol (cravings).   Continued use of alcohol despite problems at work, school, or home because of alcohol use.   Continued use of alcohol despite problems in relationships because of alcohol use.  Continued use of alcohol in situations when it is physically  hazardous, such as driving a car.  Continued use of alcohol despite awareness of a physical or psychological problem that is likely related to alcohol use. Physical problems related to alcohol use can involve the brain, heart, liver, stomach, and intestines. Psychological problems related to alcohol use include intoxication, depression, anxiety, psychosis, delirium, and dementia.   The need for increased amounts of alcohol to achieve the same desired effect, or a decreased effect from the consumption of the same amount of alcohol (tolerance).  Withdrawal symptoms upon reducing or stopping alcohol use, or alcohol use to reduce or avoid withdrawal symptoms. Withdrawal symptoms include:  Racing heart.  Hand tremor.  Difficulty sleeping.  Nausea.  Vomiting.  Hallucinations.  Restlessness.  Seizures. DIAGNOSIS Alcohol use disorder is diagnosed through an assessment by your health care provider. Your health care provider may start by asking three or four questions to screen for excessive or problematic alcohol use. To confirm a diagnosis of alcohol use disorder, at least two symptoms must be present within a 2234-month period. The severity of alcohol use disorder depends on the number of symptoms:  Mild--two or three.  Moderate--four or five.  Severe--six or more. Your health care provider may perform a physical exam or use results from lab tests to see if you have physical problems resulting from alcohol use. Your health care provider may refer you to a mental health professional for evaluation. TREATMENT  Some people with alcohol use disorder are able to reduce their alcohol use to low-risk levels. Some people with alcohol use disorder need to quit drinking alcohol. When necessary, mental health professionals with specialized training in substance use  treatment can help. Your health care provider can help you decide how severe your alcohol use disorder is and what type of treatment you  need. The following forms of treatment are available:   Detoxification. Detoxification involves the use of prescription medicines to prevent alcohol withdrawal symptoms in the first week after quitting. This is important for people with a history of symptoms of withdrawal and for heavy drinkers who are likely to have withdrawal symptoms. Alcohol withdrawal can be dangerous and, in severe cases, cause death. Detoxification is usually provided in a hospital or in-patient substance use treatment facility.  Counseling or talk therapy. Talk therapy is provided by substance use treatment counselors. It addresses the reasons people use alcohol and ways to keep them from drinking again. The goals of talk therapy are to help people with alcohol use disorder find healthy activities and ways to cope with life stress, to identify and avoid triggers for alcohol use, and to handle cravings, which can cause relapse.  Medicines.Different medicines can help treat alcohol use disorder through the following actions:  Decrease alcohol cravings.  Decrease the positive reward response felt from alcohol use.  Produce an uncomfortable physical reaction when alcohol is used (aversion therapy).  Support groups. Support groups are run by people who have quit drinking. They provide emotional support, advice, and guidance. These forms of treatment are often combined. Some people with alcohol use disorder benefit from intensive combination treatment provided by specialized substance use treatment centers. Both inpatient and outpatient treatment programs are available.   This information is not intended to replace advice given to you by your health care provider. Make sure you discuss any questions you have with your health care provider.   Document Released: 06/17/2004 Document Revised: 05/31/2014 Document Reviewed: 08/17/2012 Elsevier Interactive Patient Education [employer]

## 2015-07-28 NOTE — ED Notes (Signed)
Father, Asencion GowdaCharles Bensen, [phone]

## 2015-07-28 NOTE — ED Provider Notes (Signed)
30 y.o. male with a history of alcohol abuse presenting for detox. Initially, the patient was medically cleared and evaluated by psychiatry. The plan was for inpatient detoxification. However, the patient is a registered sex under and there are no inpatient detox centers that will accept him for treatment. He has been given a list of multiple outpatient resources by Claudine, the Child psychotherapistsocial worker. At this time, his vital signs are reassuring and he is not exhibiting signs or symptoms of acute DTs. Plan discharge with close follow-up.  Rockne MenghiniAnne-Caroline Ky Rumple, MD 07/28/15 1513

## 2015-07-28 NOTE — ED Notes (Signed)
Pt states he is feeling somewhat better since given ativan. Denies nausea, no tremors noted, skin is warm and dry..Marland Kitchen

## 2015-07-28 NOTE — ED Provider Notes (Signed)
-----------------------------------------   6:29 AM on 07/28/2015 -----------------------------------------   Blood pressure 124/79, pulse 92, temperature 98.2 F (36.8 C), temperature source Oral, resp. rate 18, height 6\' 4"  (1.93 m), weight 150 lb (68.04 kg), SpO2 98 %.  The patient had no acute events since last update.  Calm and cooperative at this time.  Repeat alcohol level pending. There may be a bed available at [facility] pending repeat alcohol level. Disposition is pending per Psychiatry/Behavioral Medicine team recommendations.     Irean HongJade J Sung, MD 07/28/15 0630

## 2015-07-28 NOTE — [facility] Counselor (Addendum)
Micheal [name] form [facility] called, pt. Was declined due to nature of withdrawal symptoms. Micheal [name], LCAS-A, LPC-A, [facility]  Counselor 07/28/2015 10:46 AM

## 2015-07-28 NOTE — ED Notes (Signed)
Spoke with Claudine [facility] [phone] about pt status, states [facility] declined pt due to Hx of seizures with withdrawal, states she is going to refer the pt to [facility] and intense outpatient Tx.
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
## 2015-07-28 NOTE — ED Notes (Signed)
Pt sister Nicholes MangoDanica came into visit with the pt and is asking for any update, I spoke with the pt and the pt does not want any information disclosed to anyone without him present or permission.

## 2015-07-28 NOTE — Progress Notes (Signed)
LCSW called [facility] and spoke to intake and reviewed patient information to see if they would consider him for treatment. LCSW refaxed information and will provide patient with [facility] numbers to call.  Jamelle Noy LCSW

## 2015-07-28 NOTE — Progress Notes (Signed)
LCSW met with patient to discuss his past successes and failures. He has been to several religious based programs that were ineffective, been to [facility], [facility], [facility], [facility] and all places have not worked. It was explained that his information was sent to both [facility] and [facility] and he was denied at [facility] due to seizures. [facility] is reviewing information. LCSW created a booklet for intensive SAIOP programs and other residential treatment facilities he can call.  Patient awaits to see the psychiatrist. 

## 2015-07-28 NOTE — Progress Notes (Signed)
LCSW [place] contacted LCSW-[place], it was explained patient has not been accepted to [facility] due to recent seizure activity in the last 2 months. In consultation LCSW will contact [facility] to see if beds are available and if patient meets medical criteria  LCSW called and spoke to ED nurse and explained situation. Will look for other possibilities and provide patient with intensive out patient treatment options ( [facility]) based on patient himself.  Delta Air LinesClaudine Yashica Sterbenz LCSW [phone]

## 2016-06-10 ENCOUNTER — Ambulatory Visit: Payer: Self-pay

## 2016-06-15 ENCOUNTER — Ambulatory Visit: Payer: Self-pay

## 2016-06-17 ENCOUNTER — Ambulatory Visit: Payer: Self-pay | Admitting: Urology

## 2016-06-17 VITALS — BP 136/82 | HR 82 | Wt 152.6 lb

## 2016-06-17 DIAGNOSIS — R000 Tachycardia, unspecified: Secondary | ICD-10-CM

## 2016-06-17 DIAGNOSIS — F419 Anxiety disorder, unspecified: Secondary | ICD-10-CM

## 2016-06-17 DIAGNOSIS — H5213 Myopia, bilateral: Secondary | ICD-10-CM

## 2016-06-17 NOTE — Progress Notes (Signed)
Patient: Micheal [name] Male    DOB: [birth year]   31 y.o.   MRN: [id number] Visit Date: 06/17/2016  Today's Provider: [facility]   Chief Complaint  Patient presents with  . Establish Care  . Anxiety   Subjective:    HPI Patient is a 31 year old Caucasian male who presents today after he tried to donate plasma and was told his pulse was too high and he needed to see a doctor.    He has brought in a paper form the plasma center that stated he had "3 OOL pulses."  I do not have the actual vitals available to me at the time of the visit.    Patient states that he is not having chest pain, SOB or light-headedness.    He does have a history of anxiety but he has not been on his medication for over five years.  He had been on benzo's in the past.    He was in recovery and was tried on different medications without relief.  He was attempting to self-medicate with alcohol and marijuana.      No Known Allergies Previous Medications   No medications on file    Review of Systems  Constitutional: Negative for appetite change, chills and fatigue.  HENT: Negative for congestion, dental problem and drooling.   Eyes: Negative for pain, discharge and itching.  Respiratory: Negative for apnea, choking and chest tightness.   Cardiovascular: Negative for chest pain, palpitations and leg swelling.  Gastrointestinal: Negative for abdominal distention, abdominal pain and anal bleeding.  Endocrine: Negative for cold intolerance, heat intolerance and polydipsia.  Genitourinary: Negative for difficulty urinating, dysuria and enuresis.  Musculoskeletal: Negative for arthralgias, back pain and gait problem.  Skin: Negative for color change, pallor and rash.  Allergic/Immunologic: Negative for environmental allergies, food allergies and immunocompromised state.  Neurological: Negative for dizziness, facial asymmetry and headaches.  Hematological: Negative for adenopathy. Does not  bruise/bleed easily.  Psychiatric/Behavioral: Negative for agitation, behavioral problems and confusion.    Social History  Substance Use Topics  . Smoking status: Current Some Day Smoker    Types: Cigarettes  . Smokeless tobacco: Never Used  . Alcohol use Yes     Comment: intermittent binge drinks, hx of etoh withdraw with seizures   Objective:   BP 136/82 (BP Location: Left Arm, Patient Position: Sitting, Cuff Size: Normal)   Pulse 82   Wt 152 lb 9.6 oz (69.2 kg)   BMI 18.58 kg/m   Physical Exam Constitutional: Well nourished. Alert and oriented, No acute distress. HEENT: [place] AT, moist mucus membranes. Trachea midline, no masses. Cardiovascular: No clubbing, cyanosis, or edema. Respiratory: Normal respiratory effort, no increased work of breathing. GI: Abdomen is soft, non tender, non distended, no abdominal masses. Liver and spleen not palpable.  No hernias appreciated.  Stool sample for occult testing is not indicated.   GU: No CVA tenderness.  No bladder fullness or masses.   Skin: No rashes, bruises or suspicious lesions. Lymph: No cervical or inguinal adenopathy. Neurologic: Grossly intact, no focal deficits, moving all 4 extremities. Psychiatric: Normal mood and affect.      Assessment & Plan:  1. Anxiety  - needs appointment with Aneta MinsPhillip  - needs referral to mental health  2. Episodes of tachycardia  - check TSH, CBC and CMP  - VSS at this visit  3. Myopia  - needs eye exam   [facility]   [facility]

## 2016-06-18 LAB — COMPREHENSIVE METABOLIC PANEL
A/G RATIO: 1.8 (ref 1.2–2.2)
ALK PHOS: 56 IU/L (ref 39–117)
ALT: 14 IU/L (ref 0–44)
AST: 22 IU/L (ref 0–40)
Albumin: 4.9 g/dL (ref 3.5–5.5)
BUN/Creatinine Ratio: 14 (ref 9–20)
BUN: 11 mg/dL (ref 6–20)
Bilirubin Total: 0.3 mg/dL (ref 0.0–1.2)
CO2: 27 mmol/L (ref 18–29)
CREATININE: 0.79 mg/dL (ref 0.76–1.27)
Calcium: 9.7 mg/dL (ref 8.7–10.2)
Chloride: 96 mmol/L (ref 96–106)
GFR calc Af Amer: 137 mL/min/{1.73_m2} (ref >59)
GFR calc non Af Amer: 119 mL/min/{1.73_m2} (ref >59)
GLOBULIN, TOTAL: 2.8 g/dL (ref 1.5–4.5)
Glucose: 91 mg/dL (ref 65–99)
POTASSIUM: 5.2 mmol/L (ref 3.5–5.2)
SODIUM: 139 mmol/L (ref 134–144)
Total Protein: 7.7 g/dL (ref 6.0–8.5)

## 2016-06-18 LAB — CBC WITH DIFFERENTIAL/PLATELET
Basophils Absolute: 0 10*3/uL (ref 0.0–0.2)
Basos: 1 %
EOS (ABSOLUTE): 0.1 10*3/uL (ref 0.0–0.4)
EOS: 2 %
HEMATOCRIT: 40.8 % (ref 37.5–51.0)
Hemoglobin: 13.5 g/dL (ref 13.0–17.7)
Immature Grans (Abs): 0 10*3/uL (ref 0.0–0.1)
Immature Granulocytes: 0 %
LYMPHS ABS: 1.6 10*3/uL (ref 0.7–3.1)
Lymphs: 28 %
MCH: 31.8 pg (ref 26.6–33.0)
MCHC: 33.1 g/dL (ref 31.5–35.7)
MCV: 96 fL (ref 79–97)
MONOS ABS: 0.7 10*3/uL (ref 0.1–0.9)
Monocytes: 13 %
Neutrophils Absolute: 3 10*3/uL (ref 1.4–7.0)
Neutrophils: 56 %
Platelets: 212 10*3/uL (ref 150–379)
RBC: 4.25 x10E6/uL (ref 4.14–5.80)
RDW: 13 % (ref 12.3–15.4)
WBC: 5.5 10*3/uL (ref 3.4–10.8)

## 2016-06-18 LAB — TSH: TSH: 4.13 u[IU]/mL (ref 0.450–4.500)

## 2016-06-23 ENCOUNTER — Ambulatory Visit: Payer: Self-pay | Admitting: Ophthalmology

## 2016-09-16 ENCOUNTER — Ambulatory Visit: Payer: Self-pay

## 2016-10-04 ENCOUNTER — Encounter: Payer: Self-pay | Admitting: Emergency Medicine

## 2016-10-04 ENCOUNTER — Emergency Department
Admission: EM | Admit: 2016-10-04 | Discharge: 2016-10-04 | Disposition: A | Discharge status: Home / Self Care | Payer: Self-pay | Attending: Emergency Medicine | Admitting: Emergency Medicine

## 2016-10-04 ENCOUNTER — Emergency Department: Payer: Self-pay

## 2016-10-04 DIAGNOSIS — F17210 Nicotine dependence, cigarettes, uncomplicated: Secondary | ICD-10-CM | POA: Insufficient documentation

## 2016-10-04 DIAGNOSIS — J90 Pleural effusion, not elsewhere classified: Secondary | ICD-10-CM | POA: Insufficient documentation

## 2016-10-04 DIAGNOSIS — R0781 Pleurodynia: Secondary | ICD-10-CM

## 2016-10-04 DIAGNOSIS — I4510 Unspecified right bundle-branch block: Secondary | ICD-10-CM | POA: Insufficient documentation

## 2016-10-04 DIAGNOSIS — J181 Lobar pneumonia, unspecified organism: Secondary | ICD-10-CM | POA: Insufficient documentation

## 2016-10-04 DIAGNOSIS — J189 Pneumonia, unspecified organism: Secondary | ICD-10-CM

## 2016-10-04 LAB — BASIC METABOLIC PANEL
ANION GAP: 8 (ref 5–15)
BUN: 5 mg/dL — ABNORMAL LOW (ref 6–20)
CHLORIDE: 98 mmol/L — AB (ref 101–111)
CO2: 29 mmol/L (ref 22–32)
Calcium: 9.1 mg/dL (ref 8.9–10.3)
Creatinine, Ser: 0.52 mg/dL — ABNORMAL LOW (ref 0.61–1.24)
GFR calc non Af Amer: >60 mL/min (ref >60)
Glucose, Bld: 96 mg/dL (ref 65–99)
Potassium: 3.7 mmol/L (ref 3.5–5.1)
Sodium: 135 mmol/L (ref 135–145)

## 2016-10-04 LAB — CBC
HCT: 35.4 % — ABNORMAL LOW (ref 40.0–52.0)
HEMOGLOBIN: 12.4 g/dL — AB (ref 13.0–18.0)
MCH: 32.9 pg (ref 26.0–34.0)
MCHC: 35 g/dL (ref 32.0–36.0)
MCV: 93.9 fL (ref 80.0–100.0)
Platelets: 181 10*3/uL (ref 150–440)
RBC: 3.77 MIL/uL — AB (ref 4.40–5.90)
RDW: 12.4 % (ref 11.5–14.5)
WBC: 9.9 10*3/uL (ref 3.8–10.6)

## 2016-10-04 LAB — TROPONIN I: Troponin I: <0.03 ng/mL (ref <0.03)

## 2016-10-04 MED ORDER — LEVOFLOXACIN 750 MG PO TABS
ORAL_TABLET | ORAL | Status: AC
  Administered 2016-10-04: 750 mg via ORAL
  Filled 2016-10-04: qty 1

## 2016-10-04 MED ORDER — IBUPROFEN 800 MG PO TABS
800.0000 mg | ORAL_TABLET | Freq: Three times a day (TID) | ORAL | 0 refills | Status: DC | PRN
Start: 2016-10-04 — End: 2019-11-13

## 2016-10-04 MED ORDER — IBUPROFEN 800 MG PO TABS
ORAL_TABLET | ORAL | Status: AC
  Filled 2016-10-04: qty 1

## 2016-10-04 MED ORDER — LEVOFLOXACIN 750 MG PO TABS: 750.0000 mg | ORAL_TABLET | Freq: Every day | ORAL | 0 refills | Status: AC

## 2016-10-04 MED ORDER — LEVOFLOXACIN 750 MG PO TABS
750.0000 mg | ORAL_TABLET | Freq: Once | ORAL | Status: AC
  Administered 2016-10-04: 750 mg via ORAL

## 2016-10-04 MED ORDER — IBUPROFEN 800 MG PO TABS
800.0000 mg | ORAL_TABLET | Freq: Once | ORAL | Status: AC
  Administered 2016-10-04: 800 mg via ORAL

## 2016-10-04 MED ORDER — IOPAMIDOL (ISOVUE-370) INJECTION 76%
75.0000 mL | Freq: Once | INTRAVENOUS | Status: AC | PRN
  Administered 2016-10-04: 75 mL via INTRAVENOUS
  Filled 2016-10-04: qty 75

## 2016-10-04 MED ORDER — SODIUM CHLORIDE 0.9 % IV BOLUS (SEPSIS)
1000.0000 mL | Freq: Once | INTRAVENOUS | Status: AC
Start: 2016-10-04 — End: 2016-10-04
  Administered 2016-10-04: 1000 mL via INTRAVENOUS

## 2016-10-04 NOTE — ED Triage Notes (Signed)
Pt states he woke up last pm with sharp left sided cp, almost feels like a broken rib per patient. States he is a recovering alcoholic and relapsed last week, states he got very sick and threw up a lot, possibly seized and wonders if he hurt his rib then. NAD.

## 2016-10-04 NOTE — Discharge Instructions (Signed)
Please take the entire course of antibiotics, even if you're feeling better. You may alternate Tylenol or Motrin for ear pain, but do not take more than the recommended amount as this can result in liver or kidney failure.  Please make an appointment to follow-up with a primary care physician in 3 days.  Return to the emergency department if you develop severe pain, fever, shortness of breath, lightheadedness or fainting, or any other symptoms concerning to you.

## 2016-10-04 NOTE — ED Notes (Signed)
Patient transported to CT 

## 2016-10-04 NOTE — ED Provider Notes (Signed)
[facility] Emergency Department Provider Note  ____________________________________________  Time seen: Approximately 2:27 PM  I have reviewed the triage vital signs and the nursing notes.   HISTORY  Chief Complaint Chest Pain    HPI Micheal [name] is a 31 y.o. male with a history of alcohol dependence and related seizures, anxiety disorder, presenting with chest pain. The patient reports that last week he had 3 days of a significant amount of EtOH consumption followed by 3 days of nausea and vomiting, inability to keep down fluids. Over the past 2 or 3 days, he has been feeling better and has been able to keep down liquids. Yesterday morning at 4 AM he woke up with a "throbbing" pain in the left side of his chest, that felt "like my rib was broken." It is much worse with deep breaths.  Not palpable and not worse with positional changes. He does not remember any trauma, but cannot say that he has not had a seizure. He denies any skin changes including ecchymosis, swelling, or abrasion. He has tried 600 mg of ibuprofen, Tylenol, aspirin without improvement. The patient reports some associated shortness of breath, but thinks this may be due to his anxiety. He denies any palpitations, lightheadedness or syncope, calf pain or lower extremity swelling.  SH: Positive EtOH.  FH: Father with MI at age 82. No family history of blood clots.   Past Medical History:  Diagnosis Date  . Anxiety   . ETOH abuse   . Panic disorder   . Seizures (HCC)     There are no active problems to display for this patient.   History reviewed. No pertinent surgical history.    Allergies Patient has no known allergies.  No family history on file.  Social History Social History  Substance Use Topics  . Smoking status: Current Some Day Smoker    Types: Cigarettes  . Smokeless tobacco: Never Used  . Alcohol use Yes     Comment: intermittent binge drinks, hx of etoh withdraw  with seizures    Review of Systems Constitutional: No fever/chills. Eyes: No visual changes. ENT: No sore throat. No congestion or rhinorrhea. Cardiovascular: Positive chest pain. Denies palpitations. Respiratory: Denies shortness of breath.  No cough. Gastrointestinal: No abdominal pain.  Positive nausea and vomiting, now resolved.  No diarrhea.  No constipation. Genitourinary: Negative for dysuria. Musculoskeletal: Negative for back pain. Skin: Negative for rash. Neurological: Negative for headaches. No focal numbness, tingling or weakness.  Psych: Positive anxiety. 10-point ROS otherwise negative.  ____________________________________________   PHYSICAL EXAM:  VITAL SIGNS: ED Triage Vitals [10/04/16 1134]  Enc Vitals Group     BP      Pulse      Resp      Temp      Temp src      SpO2      Weight 145 lb (65.8 kg)     Height 6\' 4"  (1.93 m)     Head Circumference      Peak Flow      Pain Score 5     Pain Loc      Pain Edu?      Excl. in GC?     Constitutional: Alert and oriented. Well appearing and in no acute distress. Answers questions appropriately. Eyes: Conjunctivae are normal.  EOMI. No scleral icterus. Head: Atraumatic. Nose: No congestion/rhinnorhea. Mouth/Throat: Mucous membranes are moist.  Neck: No stridor.  Supple.   Cardiovascular: Normal rate, regular rhythm. No  murmurs, rubs or gallops.  Respiratory: Normal respiratory effort.  No accessory muscle use or retractions. Lungs CTAB.  No wheezes, rales or ronchi.The patient has some minimal tenderness to palpation 4 inches below the left axilla, but this does not reproduce his pain. There is no overlying skin lesions, ecchymosis, or palpable crepitus. Gastrointestinal: Soft, nontender and nondistended.  No guarding or rebound.  No peritoneal signs. Musculoskeletal: No LE edema. No ttp in the calves or palpable cords.  Negative Homan's sign. Neurologic:  A&Ox3.  Speech is clear.  Face and smile are  symmetric.  EOMI.  Moves all extremities well. Skin:  Skin is warm, dry and intact. No rash noted. Psychiatric: Mood and affect are normal. Speech and behavior are normal.  Normal judgement.  ____________________________________________   LABS (all labs ordered are listed, but only abnormal results are displayed)  Labs Reviewed  BASIC METABOLIC PANEL - Abnormal; Notable for the following:       Result Value   Chloride 98 (*)    BUN 5 (*)    Creatinine, Ser 0.52 (*)    All other components within normal limits  CBC - Abnormal; Notable for the following:    RBC 3.77 (*)    Hemoglobin 12.4 (*)    HCT 35.4 (*)    All other components within normal limits  TROPONIN I  TROPONIN I   ____________________________________________  EKG  ED ECG REPORT I, Rockne Menghini, the attending physician, personally viewed and interpreted this ECG.   Date: 10/04/2016  EKG Time: 1133  Rate: 96  Rhythm: normal sinus rhythm  Axis: normal  Intervals:none  ST&T Change: Right bundle branch block. The lateral leads do show some increased size and QRS, which may be normal variant for this thin patient or increased electrical activity, consider hypertrophy but this is less likely. No STEMI. No Brugada syndrome. No prolonged QT. ____________________________________________  RADIOLOGY  Dg Chest 2 View  Result Date: 10/04/2016 CLINICAL DATA:  Left-sided chest pain EXAM: CHEST  2 VIEW COMPARISON:  August 22, 2014 FINDINGS: There is a probable nipple shadow on the left. In addition, there is a nodular opacity in the lingula anteriorly measuring 1.2 x 0.9 x 1.1 cm. There is scarring in each lung apex. The lungs elsewhere are clear. Heart size and pulmonary vascularity are normal. No adenopathy. No bone lesions. IMPRESSION: Nodular opacity anterior lingula. This finding was not present on prior study. Advise noncontrast enhanced chest CT to further assess. There is also a probable nipple shadow on the  left. Scarring is noted in each apex. No edema or consolidation. Stable cardiac silhouette. Electronically Signed   By: Bretta Bang III M.D.   On: 10/04/2016 12:42   Ct Angio Chest Pe W Or Wo Contrast  Result Date: 10/04/2016 CLINICAL DATA:  Lambert Mody LEFT chest pain today, possible rib injury from vomiting. EXAM: CT ANGIOGRAPHY CHEST WITH CONTRAST TECHNIQUE: Multidetector CT imaging of the chest was performed using the standard protocol during bolus administration of intravenous contrast. Multiplanar CT image reconstructions and MIPs were obtained to evaluate the vascular anatomy. CONTRAST:  75 cc Isovue 370 COMPARISON:  Chest radiograph Oct 04, 2016 at 1223 hours FINDINGS: CARDIOVASCULAR: Adequate contrast opacification of the pulmonary artery's. Main pulmonary artery is not enlarged. No pulmonary arterial filling defects to the level of the segmental branches though limited by respiratory motion. Heart size is normal, no right heart strain. No pericardial effusion. Thoracic aorta is normal course and caliber, unremarkable. MEDIASTINUM/NODES: 15 mm short axis subcarinal  lymph nodes with prominent though not pathologically enlarged bilateral hilar lymph nodes. LUNGS/PLEURA: Tracheobronchial tree is patent, no pneumothorax. Biapical nodular pleural thickening. Lingular and LEFT upper lobe patchy ground-glass opacities and nodular consolidation. Trace LEFT pleural effusion. Mild centrilobular emphysema. UPPER ABDOMEN: Included view of the abdomen is unremarkable. MUSCULOSKELETAL: Visualized soft tissues and included osseous structures appear normal. No rib fracture. Review of the MIP images confirms the above findings. IMPRESSION: No acute pulmonary embolism. Multifocal LEFT pneumonia.  Trace LEFT pleural effusion. Mild emphysema. Electronically Signed   By: Awilda Metroourtnay  Bloomer M.D.   On: 10/04/2016 15:16    ____________________________________________   PROCEDURES  Procedure(s) performed:  None  Procedures  Critical Care performed: No ____________________________________________   INITIAL IMPRESSION / ASSESSMENT AND PLAN / ED COURSE  Pertinent labs & imaging results that were available during my care of the patient were reviewed by me and considered in my medical decision making (see chart for details).  31 y.o. male with a history of EtOH abuse presenting with pleuritic left chest pain. Overall, the patient has reassuring vital signs. His EKG does not show ischemic changes, and he is at low risk for ACS. However, he does have difficulty taking deep breaths on my examination, we'll get a CT scan for further evaluation, including evaluation for PE. Given that he is a tall thin male, small pneumothorax that is not visible on chest x-ray is also possible but there is no evidence of large pneumothorax. His chest x-ray does show a nodular opacity over the anterior lingula, which will be further evaluated with a CT scan. Plan reevaluation for final disposition. ----------------------------------------- 4:45 PM on 10/04/2016 -----------------------------------------  The patient does not have a PE on CT. He does however, have multifocal left-sided pneumonia with a trace left pleural effusion. Here, the patient is afebrile, hemodynamically stable with a normal oxygenation, with a normal white blood cell count. He does not meet criteria for inpatient admission, and will be discharged home. He will receive a dose of Levaquin in the emergency department and have a prescription for a 7 day course. He understands that he may be at risk for aspiration pneumonia given his alcohol binge, and that he needs to be reevaluated in 3 days for repeat examination. Return precautions as well as follow-up instructions were discussed with the patient and his father.   ____________________________________________  FINAL CLINICAL IMPRESSION(S) / ED DIAGNOSES  Final diagnoses:  Pleuritic chest pain   Pneumonia of left lung due to infectious organism, unspecified part of lung  Pleural effusion on left  Right bundle branch block         NEW MEDICATIONS STARTED DURING THIS VISIT:  New Prescriptions   IBUPROFEN (ADVIL,MOTRIN) 800 MG TABLET    Take 1 tablet (800 mg total) by mouth every 8 (eight) hours as needed.   LEVOFLOXACIN (LEVAQUIN) 750 MG TABLET    Take 1 tablet (750 mg total) by mouth daily.      Rockne MenghiniNorman, Anne-Caroline, MD 10/04/16 [phone]

## 2019-11-13 ENCOUNTER — Inpatient Hospital Stay
Admission: EM | Admit: 2019-11-13 | Discharge: 2019-11-15 | DRG: 896 | Disposition: A | Discharge status: Home / Self Care | Payer: Self-pay | Attending: Internal Medicine | Admitting: Internal Medicine

## 2019-11-13 DIAGNOSIS — E871 Hypo-osmolality and hyponatremia: Secondary | ICD-10-CM | POA: Diagnosis present

## 2019-11-13 DIAGNOSIS — D696 Thrombocytopenia, unspecified: Secondary | ICD-10-CM

## 2019-11-13 DIAGNOSIS — E876 Hypokalemia: Secondary | ICD-10-CM | POA: Diagnosis present

## 2019-11-13 DIAGNOSIS — F1910 Other psychoactive substance abuse, uncomplicated: Secondary | ICD-10-CM

## 2019-11-13 DIAGNOSIS — Z20822 Contact with and (suspected) exposure to covid-19: Secondary | ICD-10-CM | POA: Diagnosis present

## 2019-11-13 DIAGNOSIS — F1290 Cannabis use, unspecified, uncomplicated: Secondary | ICD-10-CM | POA: Diagnosis present

## 2019-11-13 DIAGNOSIS — R17 Unspecified jaundice: Secondary | ICD-10-CM | POA: Diagnosis present

## 2019-11-13 DIAGNOSIS — F410 Panic disorder [episodic paroxysmal anxiety] without agoraphobia: Secondary | ICD-10-CM | POA: Diagnosis present

## 2019-11-13 DIAGNOSIS — E860 Dehydration: Secondary | ICD-10-CM | POA: Diagnosis present

## 2019-11-13 DIAGNOSIS — F10931 Alcohol use, unspecified with withdrawal delirium: Secondary | ICD-10-CM

## 2019-11-13 DIAGNOSIS — F17210 Nicotine dependence, cigarettes, uncomplicated: Secondary | ICD-10-CM | POA: Diagnosis present

## 2019-11-13 DIAGNOSIS — F10139 Alcohol abuse with withdrawal, unspecified: Principal | ICD-10-CM | POA: Diagnosis present

## 2019-11-13 DIAGNOSIS — Y900 Blood alcohol level of less than 20 mg/100 ml: Secondary | ICD-10-CM | POA: Diagnosis present

## 2019-11-13 DIAGNOSIS — F10939 Alcohol use, unspecified with withdrawal, unspecified: Secondary | ICD-10-CM | POA: Diagnosis present

## 2019-11-13 DIAGNOSIS — F10231 Alcohol dependence with withdrawal delirium: Secondary | ICD-10-CM

## 2019-11-13 DIAGNOSIS — D6959 Other secondary thrombocytopenia: Secondary | ICD-10-CM | POA: Diagnosis present

## 2019-11-13 DIAGNOSIS — G9341 Metabolic encephalopathy: Secondary | ICD-10-CM | POA: Diagnosis present

## 2019-11-13 DIAGNOSIS — R739 Hyperglycemia, unspecified: Secondary | ICD-10-CM | POA: Diagnosis present

## 2019-11-13 LAB — BASIC METABOLIC PANEL
Anion gap: 12 (ref 5–15)
BUN: 13 mg/dL (ref 6–20)
CO2: 30 mmol/L (ref 22–32)
Calcium: 9.1 mg/dL (ref 8.9–10.3)
Chloride: 82 mmol/L — ABNORMAL LOW (ref 98–111)
Creatinine, Ser: 0.73 mg/dL (ref 0.61–1.24)
GFR calc Af Amer: >60 mL/min (ref >60)
GFR calc non Af Amer: >60 mL/min (ref >60)
Glucose, Bld: 122 mg/dL — ABNORMAL HIGH (ref 70–99)
Potassium: 2.6 mmol/L — CL (ref 3.5–5.1)
Sodium: 124 mmol/L — ABNORMAL LOW (ref 135–145)

## 2019-11-13 LAB — CBC WITH DIFFERENTIAL/PLATELET
Abs Immature Granulocytes: 0.02 10*3/uL (ref 0.00–0.07)
Basophils Absolute: 0 10*3/uL (ref 0.0–0.1)
Basophils Relative: 0 %
Eosinophils Absolute: 0 10*3/uL (ref 0.0–0.5)
Eosinophils Relative: 0 %
HCT: 36.1 % — ABNORMAL LOW (ref 39.0–52.0)
Hemoglobin: 13.1 g/dL (ref 13.0–17.0)
Immature Granulocytes: 0 %
Lymphocytes Relative: 17 %
Lymphs Abs: 1 10*3/uL (ref 0.7–4.0)
MCH: 32.2 pg (ref 26.0–34.0)
MCHC: 36.3 g/dL — ABNORMAL HIGH (ref 30.0–36.0)
MCV: 88.7 fL (ref 80.0–100.0)
Monocytes Absolute: 0.8 10*3/uL (ref 0.1–1.0)
Monocytes Relative: 13 %
Neutro Abs: 4.2 10*3/uL (ref 1.7–7.7)
Neutrophils Relative %: 70 %
Platelets: 109 10*3/uL — ABNORMAL LOW (ref 150–400)
RBC: 4.07 MIL/uL — ABNORMAL LOW (ref 4.22–5.81)
RDW: 11.4 % — ABNORMAL LOW (ref 11.5–15.5)
Smear Review: DECREASED
WBC: 6 10*3/uL (ref 4.0–10.5)
nRBC: 0 % (ref 0.0–0.2)

## 2019-11-13 LAB — COMPREHENSIVE METABOLIC PANEL
ALT: 39 U/L (ref 0–44)
AST: 35 U/L (ref 15–41)
Albumin: 4.6 g/dL (ref 3.5–5.0)
Alkaline Phosphatase: 51 U/L (ref 38–126)
Anion gap: 13 (ref 5–15)
BUN: 15 mg/dL (ref 6–20)
CO2: 27 mmol/L (ref 22–32)
Calcium: 9.3 mg/dL (ref 8.9–10.3)
Chloride: 83 mmol/L — ABNORMAL LOW (ref 98–111)
Creatinine, Ser: 0.76 mg/dL (ref 0.61–1.24)
GFR calc Af Amer: >60 mL/min (ref >60)
GFR calc non Af Amer: >60 mL/min (ref >60)
Glucose, Bld: 114 mg/dL — ABNORMAL HIGH (ref 70–99)
Potassium: 2.5 mmol/L — CL (ref 3.5–5.1)
Sodium: 123 mmol/L — ABNORMAL LOW (ref 135–145)
Total Bilirubin: 2.3 mg/dL — ABNORMAL HIGH (ref 0.3–1.2)
Total Protein: 7.5 g/dL (ref 6.5–8.1)

## 2019-11-13 LAB — MAGNESIUM
Magnesium: 2 mg/dL (ref 1.7–2.4)
Magnesium: 2.4 mg/dL (ref 1.7–2.4)

## 2019-11-13 LAB — SARS CORONAVIRUS 2 BY RT PCR (HOSPITAL ORDER, PERFORMED IN ~~LOC~~ HOSPITAL LAB): SARS Coronavirus 2: NEGATIVE

## 2019-11-13 LAB — SODIUM: Sodium: 131 mmol/L — ABNORMAL LOW (ref 135–145)

## 2019-11-13 LAB — ETHANOL: Alcohol, Ethyl (B): <10 mg/dL (ref <10)

## 2019-11-13 LAB — PHOSPHORUS: Phosphorus: 3 mg/dL (ref 2.5–4.6)

## 2019-11-13 LAB — LIPASE, BLOOD: Lipase: 30 U/L (ref 11–51)

## 2019-11-13 LAB — POTASSIUM: Potassium: 2.3 mmol/L — CL (ref 3.5–5.1)

## 2019-11-13 MED ORDER — LORAZEPAM 2 MG/ML IJ SOLN
0.0000 mg | Freq: Four times a day (QID) | INTRAMUSCULAR | Status: DC
  Administered 2019-11-13 – 2019-11-14 (×2): 2 mg via INTRAVENOUS
  Filled 2019-11-13: qty 1
  Filled 2019-11-13: qty 2
  Filled 2019-11-13: qty 1

## 2019-11-13 MED ORDER — POTASSIUM CHLORIDE 10 MEQ/100ML IV SOLN
10.0000 meq | INTRAVENOUS | Status: AC
  Administered 2019-11-13 – 2019-11-14 (×6): 10 meq via INTRAVENOUS
  Filled 2019-11-13 (×6): qty 100

## 2019-11-13 MED ORDER — LORAZEPAM 2 MG/ML IJ SOLN
0.0000 mg | Freq: Two times a day (BID) | INTRAMUSCULAR | Status: DC
  Administered 2019-11-13: 2 mg via INTRAVENOUS

## 2019-11-13 MED ORDER — FOLIC ACID 1 MG PO TABS
1.0000 mg | ORAL_TABLET | Freq: Every day | ORAL | Status: DC
  Administered 2019-11-13 – 2019-11-15 (×3): 1 mg via ORAL
  Filled 2019-11-13: qty 1

## 2019-11-13 MED ORDER — DEXMEDETOMIDINE HCL IN NACL 400 MCG/100ML IV SOLN
0.4000 ug/kg/h | INTRAVENOUS | Status: DC
  Administered 2019-11-13: 0.4 ug/kg/h via INTRAVENOUS
  Administered 2019-11-14: 0.5 ug/kg/h via INTRAVENOUS
  Filled 2019-11-13 (×2): qty 100

## 2019-11-13 MED ORDER — BISACODYL 5 MG PO TBEC: 5.0000 mg | DELAYED_RELEASE_TABLET | Freq: Every day | ORAL | Status: DC | PRN

## 2019-11-13 MED ORDER — SODIUM CHLORIDE 0.9 % IV BOLUS: 1000.0000 mL | Freq: Once | INTRAVENOUS | Status: DC

## 2019-11-13 MED ORDER — CHLORDIAZEPOXIDE HCL 25 MG PO CAPS
25.0000 mg | ORAL_CAPSULE | Freq: Once | ORAL | Status: AC
  Administered 2019-11-13: 25 mg via ORAL
  Filled 2019-11-13: qty 1

## 2019-11-13 MED ORDER — LORAZEPAM 1 MG PO TABS
1.0000 mg | ORAL_TABLET | Freq: Once | ORAL | Status: AC
  Administered 2019-11-13: 1 mg via ORAL
  Filled 2019-11-13: qty 1

## 2019-11-13 MED ORDER — MAGNESIUM SULFATE 2 GM/50ML IV SOLN
2.0000 g | Freq: Once | INTRAVENOUS | Status: AC
  Administered 2019-11-13: 2 g via INTRAVENOUS
  Filled 2019-11-13: qty 50

## 2019-11-13 MED ORDER — POTASSIUM CHLORIDE 10 MEQ/100ML IV SOLN
10.0000 meq | Freq: Once | INTRAVENOUS | Status: AC
  Administered 2019-11-13: 10 meq via INTRAVENOUS
  Filled 2019-11-13: qty 100

## 2019-11-13 MED ORDER — LORAZEPAM 2 MG PO TABS: 0.0000 mg | ORAL_TABLET | Freq: Two times a day (BID) | ORAL | Status: DC

## 2019-11-13 MED ORDER — THIAMINE HCL 100 MG/ML IJ SOLN
100.0000 mg | Freq: Every day | INTRAMUSCULAR | Status: DC
  Administered 2019-11-14: 100 mg via INTRAVENOUS
  Filled 2019-11-13 (×2): qty 2

## 2019-11-13 MED ORDER — SODIUM CHLORIDE 0.9 % IV SOLN: INTRAVENOUS | Status: DC

## 2019-11-13 MED ORDER — SODIUM CHLORIDE 0.9 % IV SOLN: Freq: Once | INTRAVENOUS | Status: AC

## 2019-11-13 MED ORDER — THIAMINE HCL 100 MG PO TABS
100.0000 mg | ORAL_TABLET | Freq: Every day | ORAL | Status: DC
  Administered 2019-11-13 – 2019-11-15 (×2): 100 mg via ORAL
  Filled 2019-11-13 (×2): qty 1

## 2019-11-13 MED ORDER — POTASSIUM CHLORIDE IN NACL 40-0.9 MEQ/L-% IV SOLN
INTRAVENOUS | Status: DC
  Filled 2019-11-13: qty 1000

## 2019-11-13 MED ORDER — ONDANSETRON HCL 4 MG PO TABS: 4.0000 mg | ORAL_TABLET | Freq: Four times a day (QID) | ORAL | Status: DC | PRN

## 2019-11-13 MED ORDER — SODIUM CHLORIDE 0.9 % IV BOLUS
1000.0000 mL | Freq: Once | INTRAVENOUS | Status: AC
  Administered 2019-11-13: 1000 mL via INTRAVENOUS

## 2019-11-13 MED ORDER — POTASSIUM CHLORIDE CRYS ER 20 MEQ PO TBCR
40.0000 meq | EXTENDED_RELEASE_TABLET | Freq: Once | ORAL | Status: AC
  Administered 2019-11-13: 40 meq via ORAL

## 2019-11-13 MED ORDER — LORAZEPAM 2 MG PO TABS
0.0000 mg | ORAL_TABLET | Freq: Four times a day (QID) | ORAL | Status: DC
  Administered 2019-11-13: 2 mg via ORAL
  Filled 2019-11-13: qty 1

## 2019-11-13 MED ORDER — LORAZEPAM 2 MG PO TABS
2.0000 mg | ORAL_TABLET | Freq: Once | ORAL | Status: AC
  Administered 2019-11-13: 2 mg via ORAL
  Filled 2019-11-13: qty 1

## 2019-11-13 MED ORDER — CHLORDIAZEPOXIDE HCL 25 MG PO CAPS
50.0000 mg | ORAL_CAPSULE | ORAL | Status: AC
  Administered 2019-11-13: 50 mg via ORAL
  Filled 2019-11-13: qty 2

## 2019-11-13 MED ORDER — ONDANSETRON HCL 4 MG/2ML IJ SOLN: 4.0000 mg | Freq: Four times a day (QID) | INTRAMUSCULAR | Status: DC | PRN

## 2019-11-13 MED ORDER — ENOXAPARIN SODIUM 40 MG/0.4ML ~~LOC~~ SOLN
40.0000 mg | SUBCUTANEOUS | Status: DC
  Administered 2019-11-13: 40 mg via SUBCUTANEOUS
  Filled 2019-11-13 (×2): qty 0.4

## 2019-11-13 NOTE — ED Provider Notes (Addendum)
[facility] Emergency Department Provider Note    First MD Initiated Contact with Patient 11/13/19 1035     (approximate)  I have reviewed the triage vital signs and the nursing notes.   HISTORY  Chief Complaint Anxiety    HPI Micheal [name] is a 34 y.o. male the below listed past medical history presents to the ER for severe anxiety and feeling like he is about to have a seizure.  States that 4 days ago was drinking very heavily.  He then stopped did have a beer yesterday but woke up this morning feeling like he was hallucinating stating that he was watching TV and it looked abnormal.  States that this happens when he starts feeling very anxious or if he feels like he is about to go into withdrawal.  States he also smoked marijuana yesterday.  Denies any pain.  States he was having some vomiting after drinking heavily several days ago but denies any nausea or vomiting at this time.  Denies any SI or HI.    Past Medical History:  Diagnosis Date  . Anxiety   . ETOH abuse   . Panic disorder   . Seizures ([place])    No family history on file. No past surgical history on file. There are no problems to display for this patient.     Prior to Admission medications   Medication Sig Start Date End Date Taking? Authorizing Provider  acetaminophen (TYLENOL) 325 MG tablet Take 650 mg by mouth every 6 (six) hours as needed.   Yes [provider]    Allergies Patient has no known allergies.    Social History Social History   Tobacco Use  . Smoking status: Current Some Day Smoker    Types: Cigarettes  . Smokeless tobacco: Never Used  Substance Use Topics  . Alcohol use: Yes    Comment: intermittent binge drinks, hx of etoh withdraw with seizures  . Drug use: No    Review of Systems Patient denies headaches, rhinorrhea, blurry vision, numbness, shortness of breath, chest pain, edema, cough, abdominal pain, nausea, vomiting, diarrhea, dysuria,  fevers, rashes or hallucinations unless otherwise stated above in HPI. ____________________________________________   PHYSICAL EXAM:  VITAL SIGNS: Vitals:   11/13/19 1516 11/13/19 1517  BP:    Pulse: 84 90  Resp:    Temp:    SpO2: 100% 100%    Constitutional: Alert and oriented.  Eyes: Conjunctivae are normal.  Head: Atraumatic. Nose: No congestion/rhinnorhea. Mouth/Throat: Mucous membranes are moist.   Neck: No stridor. Painless ROM.  Cardiovascular: Normal rate, regular rhythm. Grossly normal heart sounds.  Good peripheral circulation. Respiratory: Normal respiratory effort.  No retractions. Lungs CTAB. Gastrointestinal: Soft and nontender. No distention. No abdominal bruits. No CVA tenderness. Genitourinary: deferred Musculoskeletal: No lower extremity tenderness nor edema.  No joint effusions. Neurologic:  Normal speech and language. No gross focal neurologic deficits are appreciated. No facial droop Skin:  Skin is warm, dry and intact. No rash noted. Psychiatric: Mood and affect are anxious. Speech and behavior are normal.  No tremor or clonus  ____________________________________________   LABS (all labs ordered are listed, but only abnormal results are displayed)  Results for orders placed or performed during the hospital encounter of 11/13/19 (from the past 24 hour(s))  CBC with Differential/Platelet     Status: Abnormal   Collection Time: 11/13/19 10:22 AM  Result Value Ref Range   WBC 6.0 4.0 - 10.5 K/uL   RBC 4.07 (L)  4.22 - 5.81 MIL/uL   Hemoglobin 13.1 13.0 - 17.0 g/dL   HCT 09.3 (L) 39 - 52 %   MCV 88.7 80.0 - 100.0 fL   MCH 32.2 26.0 - 34.0 pg   MCHC 36.3 (H) 30.0 - 36.0 g/dL   RDW 26.7 (L) 12.4 - 58.0 %   Platelets 109 (L) 150 - 400 K/uL   nRBC 0.0 0.0 - 0.2 %   Neutrophils Relative % 70 %   Neutro Abs 4.2 1.7 - 7.7 K/uL   Lymphocytes Relative 17 %   Lymphs Abs 1.0 0.7 - 4.0 K/uL   Monocytes Relative 13 %   Monocytes Absolute 0.8 0 - 1 K/uL    Eosinophils Relative 0 %   Eosinophils Absolute 0.0 0 - 0 K/uL   Basophils Relative 0 %   Basophils Absolute 0.0 0 - 0 K/uL   WBC Morphology MORPHOLOGY UNREMARKABLE    RBC Morphology MORPHOLOGY UNREMARKABLE    Smear Review PLATELETS APPEAR DECREASED    Immature Granulocytes 0 %   Abs Immature Granulocytes 0.02 0.00 - 0.07 K/uL  Comprehensive metabolic panel     Status: Abnormal   Collection Time: 11/13/19 10:22 AM  Result Value Ref Range   Sodium 123 (L) 135 - 145 mmol/L   Potassium 2.5 (LL) 3.5 - 5.1 mmol/L   Chloride 83 (L) 98 - 111 mmol/L   CO2 27 22 - 32 mmol/L   Glucose, Bld 114 (H) 70 - 99 mg/dL   BUN 15 6 - 20 mg/dL   Creatinine, Ser 9.98 0.61 - 1.24 mg/dL   Calcium 9.3 8.9 - 33.8 mg/dL   Total Protein 7.5 6.5 - 8.1 g/dL   Albumin 4.6 3.5 - 5.0 g/dL   AST 35 15 - 41 U/L   ALT 39 0 - 44 U/L   Alkaline Phosphatase 51 38 - 126 U/L   Total Bilirubin 2.3 (H) 0.3 - 1.2 mg/dL   GFR calc non Af Amer >60 >60 mL/min   GFR calc Af Amer >60 >60 mL/min   Anion gap 13 5 - 15  Lipase, blood     Status: None   Collection Time: 11/13/19 10:22 AM  Result Value Ref Range   Lipase 30 11 - 51 U/L  Magnesium     Status: None   Collection Time: 11/13/19 10:22 AM  Result Value Ref Range   Magnesium 2.0 1.7 - 2.4 mg/dL  SARS Coronavirus 2 by RT PCR (hospital order, performed in [facility] hospital lab) Nasopharyngeal Nasopharyngeal Swab     Status: None   Collection Time: 11/13/19 10:22 AM   Specimen: Nasopharyngeal Swab  Result Value Ref Range   SARS Coronavirus 2 NEGATIVE NEGATIVE  Basic metabolic panel     Status: Abnormal   Collection Time: 11/13/19  2:10 PM  Result Value Ref Range   Sodium 124 (L) 135 - 145 mmol/L   Potassium 2.6 (LL) 3.5 - 5.1 mmol/L   Chloride 82 (L) 98 - 111 mmol/L   CO2 30 22 - 32 mmol/L   Glucose, Bld 122 (H) 70 - 99 mg/dL   BUN 13 6 - 20 mg/dL   Creatinine, Ser 2.50 0.61 - 1.24 mg/dL   Calcium 9.1 8.9 - 53.9 mg/dL   GFR calc non Af Amer >60 >60 mL/min     GFR calc Af Amer >60 >60 mL/min   Anion gap 12 5 - 15   ____________________________________________ ________________________________  RADIOLOGY   ____________________________________________   PROCEDURES  Procedure(s) performed:  .  Critical Care Performed by: Willy Eddy, MD Authorized by: Willy Eddy, MD   Critical care provider statement:    Critical care time (minutes):  30   Critical care time was exclusive of:  Separately billable procedures and treating other patients   Critical care was necessary to treat or prevent imminent or life-threatening deterioration of the following conditions:  Metabolic crisis   Critical care was time spent personally by me on the following activities:  Development of treatment plan with patient or surrogate, discussions with consultants, evaluation of patient's response to treatment, examination of patient, obtaining history from patient or surrogate, ordering and performing treatments and interventions, ordering and review of laboratory studies, ordering and review of radiographic studies, pulse oximetry, re-evaluation of patient's condition and review of old charts      Critical Care performed: yes ____________________________________________   INITIAL IMPRESSION / ASSESSMENT AND PLAN / ED COURSE  Pertinent labs & imaging results that were available during my care of the patient were reviewed by me and considered in my medical decision making (see chart for details).   DDX: withdrawal, polysubstance abuse, electrolyte abn, panic attack, gad  Micheal [name] is a 34 y.o. who presents to the ED with symptoms as described above.  Patient does appear anxious.  Given his history of polysubstance abuse and alcohol use will order blood work as well as IV fluids.  We will give some Ativan as he does appear anxious but no SI or HI.  Oriented x 3.  No focal deficits.  States he has a long h/o anxiety and panic attacks.  Clinical  Course as of Nov 13 1539  Tue Nov 13, 2019  1151 Patient reassessed.  Discussed my concern regarding the patient's hyponatremia as well as hyper bow kalemia.  His repeat abdominal exam soft benign.  I did strongly recommend admission to hospital for further medical management of his acute metabolic abnormality.  Patient is reluctant to be admitted.  States he wants to think about it.  He does demonstrate understanding that failure to treat this abnormality could result in seizures CNS compromise as well as even death.  He understands that if he were to leave or refuse discharge that would be AGAINST MEDICAL ADVICE.   [PR]  1403 Went to reassess and re-emphasize my recommendation for admission nd he removed his IV.    After additional convincing he does agree to stay for repeat blood work and additional IV fluids.   [PR]  1514 Patient again refusing to stay overnight but is agreeable to complete IV fluids and treatment at which point once that is complete we will then sign out AMA.  Care to be transferred on to oncoming physician, Dr. Scotty Court.   [PR]  1540 Patient's family now at bedside and patient is agreeable to staying for medical admission.   [PR]    Clinical Course User Index [PR] Willy Eddy, MD    The patient was evaluated in Emergency Department today for the symptoms described in the history of present illness. He/she was evaluated in the context of the global COVID-19 pandemic, which necessitated consideration that the patient might be at risk for infection with the SARS-CoV-2 virus that causes COVID-19. Institutional protocols and algorithms that pertain to the evaluation of patients at risk for COVID-19 are in a state of rapid change based on information released by regulatory bodies including the CDC and federal and state organizations. These policies and algorithms were followed during the patient's care in the ED.  As part of my medical decision making, I reviewed the following  data within the electronic MEDICAL RECORD NUMBER Nursing notes reviewed and incorporated, Labs reviewed, notes from prior ED visits and [place] Controlled Substance Database   ____________________________________________   FINAL CLINICAL IMPRESSION(S) / ED DIAGNOSES  Final diagnoses:  Hyponatremia  Hypokalemia  Polysubstance abuse (HCC)      NEW MEDICATIONS STARTED DURING THIS VISIT:  New Prescriptions   No medications on file     Note:  This document was prepared using Dragon voice recognition software and may include unintentional dictation errors.    Willy Eddy, MD 11/13/19 1517    Willy Eddy, MD 11/13/19 [phone]

## 2019-11-13 NOTE — ED Notes (Signed)
Patient reaching for things and appears to be seeing thing that are not there. Patient reoriented to environment and he makes jokes as if what he is doing never happened. Will continue to monitor.

## 2019-11-13 NOTE — H&P (Signed)
History and Physical    Micheal [name] YBO:[id number] DOB: July 19, 1985 DOA: 11/13/2019  PCP: Patient, No Pcp Per  Patient coming from: home    Chief Complaint: anxiety   HPI: 34 y/o M w/ PMH of alcohol abuse, smoker who presents w/ anxiety. Pt is a poor historian. Pt is oriented to person, year only. Pt drinks 2-3 times a week approx 1-2 40z of smirnoff ice, couple of beers & 5th of vodka. Pt also admitted to smoking marijuana today and thinks his friend put something else in the joint. Pt denies any fevers, chills, sweating, cough, chest pain, shortness of breath, nausea, vomiting, abd pain, dysuria, urinary urgency, urinary frequency, diarrhea or constipation.    Review of Systems: As per HPI otherwise 10 point review of systems negative.     Past Medical History:  Diagnosis Date  . Anxiety   . ETOH abuse   . Panic disorder   . Seizures ([place])     No past surgical history on file.   reports that he has been smoking cigarettes. He has never used smokeless tobacco. He reports current alcohol use. He reports that he does not use drugs.  No Known Allergies  No family history on file.  Pt is unable to give family hx at this time secondary to altered mental status.   Prior to Admission medications   Medication Sig Start Date End Date Taking? Authorizing Provider  acetaminophen (TYLENOL) 325 MG tablet Take 650 mg by mouth every 6 (six) hours as needed.   Yes [provider]    Physical Exam: Vitals:   11/13/19 1515 11/13/19 1516 11/13/19 1517 11/13/19 1543  BP:    (!) 132/92  Pulse: 78 84 90   Resp:      Temp:      TempSrc:      SpO2: 99% 100% 100%   Weight:      Height:        Constitutional: NAD, calm, comfortable Vitals:   11/13/19 1515 11/13/19 1516 11/13/19 1517 11/13/19 1543  BP:    (!) 132/92  Pulse: 78 84 90   Resp:      Temp:      TempSrc:      SpO2: 99% 100% 100%   Weight:      Height:      General: ataxic gait Eyes: PERRL, lids and  conjunctivae normal ENMT: Mucous membranes are moist.  Neck: normal, supple Respiratory: clear to auscultation bilaterally, no wheezing, no crackles. Normal respiratory effort.  Cardiovascular: S1/S2+, norubs / gallops. No extremity edema. Abdomen:  Soft, no tenderness, non-distended. Bowel sounds positive.  Musculoskeletal: no clubbing / cyanosis. No joint deformity upper and lower extremities. Good ROM, no contractures. Normal muscle tone.  Skin: no rashes, lesions, ulcers Neurologic: CN 2-12 grossly intact. Tremors present  Psychiatric: Abnormal judgment and insight. Alert and oriented to person & year only. Agitated.     Labs on Admission: I have personally reviewed following labs and imaging studies  CBC: Recent Labs  Lab 11/13/19 1022  WBC 6.0  NEUTROABS 4.2  HGB 13.1  HCT 36.1*  MCV 88.7  PLT 277*   Basic Metabolic Panel: Recent Labs  Lab 11/13/19 1022 11/13/19 1410  NA 123* 124*  K 2.5* 2.6*  CL 83* 82*  CO2 27 30  GLUCOSE 114* 122*  BUN 15 13  CREATININE 0.76 0.73  CALCIUM 9.3 9.1  MG 2.0  --    GFR: Estimated Creatinine Clearance: 124 mL/min (by C-G  formula based on SCr of 0.73 mg/dL). Liver Function Tests: Recent Labs  Lab 11/13/19 1022  AST 35  ALT 39  ALKPHOS 51  BILITOT 2.3*  PROT 7.5  ALBUMIN 4.6   Recent Labs  Lab 11/13/19 1022  LIPASE 30   No results for input(s): AMMONIA in the last 168 hours. Coagulation Profile: No results for input(s): INR, PROTIME in the last 168 hours. Cardiac Enzymes: No results for input(s): CKTOTAL, CKMB, CKMBINDEX, TROPONINI in the last 168 hours. BNP (last 3 results) No results for input(s): PROBNP in the last 8760 hours. HbA1C: No results for input(s): HGBA1C in the last 72 hours. CBG: No results for input(s): GLUCAP in the last 168 hours. Lipid Profile: No results for input(s): CHOL, HDL, LDLCALC, TRIG, CHOLHDL, LDLDIRECT in the last 72 hours. Thyroid Function Tests: No results for input(s): TSH,  T4TOTAL, FREET4, T3FREE, THYROIDAB in the last 72 hours. Anemia Panel: No results for input(s): VITAMINB12, FOLATE, FERRITIN, TIBC, IRON, RETICCTPCT in the last 72 hours. Urine analysis:    Component Value Date/Time   COLORURINE Yellow 11/19/2013 1522   APPEARANCEUR Clear 11/19/2013 1522   LABSPEC 1.014 11/19/2013 1522   PHURINE 6.0 11/19/2013 1522   GLUCOSEU Negative 11/19/2013 1522   HGBUR Negative 11/19/2013 1522   BILIRUBINUR Negative 11/19/2013 1522   KETONESUR Negative 11/19/2013 1522   PROTEINUR Negative 11/19/2013 1522   NITRITE Negative 11/19/2013 1522   LEUKOCYTESUR Negative 11/19/2013 1522    Radiological Exams on Admission: No results found.  EKG: Independently reviewed.  Assessment/Plan Alcohol w/drawal vs intoxication: ethanol level, urine drug screen ordered. Continue on CIWA protocol. Will likely need a sitter. Alcohol cessation counseling. Continue on thiamine and folic acid   Acute metabolic encephalopathy: secondary to above. Re-orient prn  Hyponatremia: likely acute. Likely secondary to dehydration. Continue on IVF. Repeat Na Q6H. Do not correct more than 8 mEq in 24 hours.  Hypokalemia: KCl repleted. Mg is WNL. Will continue to monitor   Hyperglycemia: no hx of DM. Will continue to monitor   Hyperbilirubinemia: likely secondary to alcohol abuse.   Thrombocytopenia: likely secondary to alcohol abuse. Will continue to monitor   Smoker: 2 cigs a day 25 years as per pt. Smoking cessation counseling. Pt refused a nicotine patch   DVT prophylaxis: lovenox Code Status: full  Family Communication:  Disposition Plan: likely d/c back home Consults called: none Admission status: inpatient   Charise Killian MD [facility] Pager [phone]  If 7PM-7AM, please contact night-coverage [email or web address]   11/13/2019, 3:50 PM

## 2019-11-13 NOTE — ED Notes (Signed)
Father arrived to ed appears to have good repor with patient. Told patient just got in from his vacation will be going home to change clothes, coming back to sit with patient.

## 2019-11-13 NOTE — ED Provider Notes (Signed)
Procedures  Clinical Course as of Nov 12 1825  Tue Nov 13, 2019  1151 Patient reassessed.  Discussed my concern regarding the patient's hyponatremia as well as hyper bow kalemia.  His repeat abdominal exam soft benign.  I did strongly recommend admission to hospital for further medical management of his acute metabolic abnormality.  Patient is reluctant to be admitted.  States he wants to think about it.  He does demonstrate understanding that failure to treat this abnormality could result in seizures CNS compromise as well as even death.  He understands that if he were to leave or refuse discharge that would be AGAINST MEDICAL ADVICE.   [PR]  1403 Went to reassess and re-emphasize my recommendation for admission nd he removed his IV.    After additional convincing he does agree to stay for repeat blood work and additional IV fluids.   [PR]  1514 Patient again refusing to stay overnight but is agreeable to complete IV fluids and treatment at which point once that is complete we will then sign out AMA.  Care to be transferred on to oncoming physician, Dr. Scotty Court.   [PR]  1540 Patient's family now at bedside and patient is agreeable to staying for medical admission.   [PR]    Clinical Course User Index [PR] Willy Eddy, MD    ----------------------------------------- 5:27 PM on 11/13/2019 ----------------------------------------- Patient is having worsening tremulousness agitation and hallucinations.  He was given 4 mg of IV Ativan for alcohol withdrawal.  ----------------------------------------- 6:27 PM on 11/13/2019 -----------------------------------------  Patient Palmer, withdrawal symptoms improved.  I will give 50 mg of oral Librium to help control withdrawal symptoms.  Patient is being admitted by hospitalist for electrolyte abnormalities.    Sharman Cheek, MD 11/13/19 [phone]

## 2019-11-13 NOTE — ED Notes (Signed)
Patient hallucinating and becoming non compliant with verbal commands. Sr. stafore at bedside ok to five 4mg  iv ativan.

## 2019-11-13 NOTE — ED Notes (Signed)
TTS consult completed, patient with erratic conversation not able to focus on 1 topic. Appears to be having tacktile stimulation, patient reaching into his pants during consults, when asked what is he looking for patient responded he lost some chicken wings, even bought hot sauce.. patient cooperative when asked to sit on bed. Patient unstable on his feet.

## 2019-11-13 NOTE — ED Notes (Signed)
New IV hep lock plced to right wrist, ns infusing. Father at bedside.

## 2019-11-13 NOTE — ED Notes (Signed)
Pt resting in bed. Pt not talking with staff but is moving in bed. Pt is more reactive after being changed and blood drawn but then quickly returns back to sleep. Pt placed in a depends after urinating in bed. New sheets applied and pt cleaned.

## 2019-11-13 NOTE — ED Notes (Signed)
Father leaving bedside. Family updated to this point.

## 2019-11-13 NOTE — ED Notes (Addendum)
Patient with increased tremors and anxiety, pacing in room and not easily following directions. Vs are stable md aware of CIWA score increasing. Ok to give protocol meds early.

## 2019-11-13 NOTE — ED Notes (Signed)
Patient pulled iv out, md aware. Repeat labs drawn.

## 2019-11-13 NOTE — Consult Note (Signed)
Name: Micheal [name] MRN: [id number] DOB: 03-24-1986    ADMISSION DATE:  11/13/2019 CONSULTATION DATE: 11/13/2019  REFERRING MD : Dr. Mayford Knife   CHIEF COMPLAINT: Anxiety   BRIEF PATIENT DESCRIPTION:  34 yo male admitted with ETOH withdrawal requiring precedex gtt   SIGNIFICANT EVENTS/STUDIES:  06/22: Pt admitted to the stepdown unit requiring precedex gtt   HISTORY OF PRESENT ILLNESS:   This is a 35 yo male with a PMH of Seizures, Panic Disorder, ETOH Abuse, and Anxiety.  He presented to [facility] ER on 06/22 with c/o anxiety.  Per ER notes the pt does have a hx of ETOH abuse, and reported he binged drank 1 week prior to ER presentation. But he did not drink alcohol again until 06/21 when he drank 1 beer and smoked marijuana with his neighbor.  He proceeded to the emergency room due to concern the marijuana was laced with something because he developed severe anxiety/hallucinations after smoking it.  In the ER pt intermittently confused and attempted to leave against medical advise a few times.  Lab results revealed Na+ 123, K+ 2.5, chloride 83, glucose 114, platelets 109, alcohol level <10, and COVID-19 negative.  Pts family arrived at bedside and the pt agreed to hospital admission for treatment.  Due to worsening agitation/dilirium precedex gtt initiated.  Pt admitted to the stepdown unit per hospitalist team and PCCM team consulted to assist with treatment plan.    PAST MEDICAL HISTORY :   has a past medical history of Anxiety, ETOH abuse, Panic disorder, and Seizures ([facility]).  has no past surgical history on file. Prior to Admission medications   Medication Sig Start Date End Date Taking? Authorizing Provider  acetaminophen (TYLENOL) 325 MG tablet Take 650 mg by mouth every 6 (six) hours as needed.   Yes [provider]   No Known Allergies  FAMILY HISTORY:  family history is not on file. SOCIAL HISTORY:  reports that he has been smoking cigarettes. He has never used  smokeless tobacco. He reports current alcohol use. He reports that he does not use drugs.  REVIEW OF SYSTEMS:   Unable to assess pt sedated   SUBJECTIVE:  Unable to assess pt sedated   VITAL SIGNS: Temp:  [98.6 F (37 C)] 98.6 F (37 C) (06/22 1014) Pulse Rate:  [75-110] 86 (06/22 1911) Resp:  [10-18] 10 (06/22 1911) BP: (123-158)/(73-96) 130/73 (06/22 1911) SpO2:  [94 %-100 %] 95 % (06/22 1911) Weight:  [68 kg] 68 kg (06/22 1016)  PHYSICAL EXAMINATION: General: acutely ill appearing male, NAD resting in bed  Neuro: sedated, not following commands, PERRL HEENT: supple, no JVD  Cardiovascular: sinus rhythm, rrr, no R/G  Lungs: clear throughout, even, non labored  Abdomen: +BS x4, soft, non distended  Musculoskeletal: normal bulk and tone, no edema  Skin: intact no rashes or lesions present   Recent Labs  Lab 11/13/19 1022 11/13/19 1410  NA 123* 124*  K 2.5* 2.6*  CL 83* 82*  CO2 27 30  BUN 15 13  CREATININE 0.76 0.73  GLUCOSE 114* 122*   Recent Labs  Lab 11/13/19 1022  HGB 13.1  HCT 36.1*  WBC 6.0  PLT 109*   No results found.  ASSESSMENT / PLAN:  Acute encephalopathy secondary to ETOH withdrawal  Hyponatremia secondary to dehydration  Hypokalemia  Thrombocytopenia  Continuous telemetry monitoring  Trend BMP  Replace electrolytes as indicated  Monitor UOP  Continue NS @75  ml/hr  Continue precedex gtt and CIWA protocol  Continue folic acid, thiamine, and mvi Sitter at bedside  Urine drug screen pending  Psychiatry consulted appreciate input  VTE px: subq lovenox Trend CBC  Monitor for s/sx of bleeding and transfuse for hgb <7   Marda Stalker, [place] Pager [phone] (please enter 7 digits) PCCM Consult Pager [phone] (please enter 7 digits)

## 2019-11-13 NOTE — ED Notes (Signed)
Patient pulled IV hep lock out, walking toward side door, patient redirected back to room. md at bedside to discus plan of care and need for admission to stablelize potassium level. Patient not comprehending importance.

## 2019-11-13 NOTE — ED Triage Notes (Signed)
Reports binge drinking 1 week ago, nothing until yesterday when he drank 1 beer and smoked weed with his neighbor, patient thinks the weed may have had something in it. present with c/o of severe anxiety.

## 2019-11-13 NOTE — BH Assessment (Signed)
Assessment Note Micheal [name] is an 34 y.o. male who presents to [facility] ED voluntarily for treatment. Per triage note, Reports binge drinking 1 week ago, nothing until yesterday when he drank 1 beer and smoked weed with his neighbor, patient thinks the weed may have had something in it. present with c/o of severe anxiety.   During TTS assessment pt appeared to be shivering, in/out of possible seizures (2-3 mins apart) speech was pressured, slurred displayed extremely paranoia and bizarre behavior thought to be psychotic in origin.  Unclear at this time whether or not pt took drugs to cause this behavior. Pt denies using any recent substances and stated "I am here to visit my friends" (referring to RN staff present). Pt was unable to maintain his balance as he attempted to walk out of the room resulting to RN staff remaining at pt's bedside to assist. Pt consented for RN staff to be present during assessment. Pt faded in/out during assessment but was cooperative and able to answer some questions with limited information. Pt was found with a bottle of Duloxetine 49ml, 60 capsules filled on 10/19/19. Pt states "Hey give me that it's for my anxiety'. Pt reports a hx of anxiety and denies an INPT or OPT hx. Per chart review pt's last admission to [facility] ED is dated for 07/27/15 for alcohol abuse. Pt Denies any current SI/HI/AH. Pt began endorsed extreme paranoia and VH (looking around the room, starring at ceiling, blank stares) in between questions and when addressed states "I'ma hit that fucker, I see the car and UFO". Due to pt's incoherent state TTS stopped assessment after receiving permission to talk with dad.   Collateral contact with dad Juanda Crumble): Dad reports returning for a 15 day trip today and observed pt to exhibit the same bizarre behaviors similar to above presentation resulting to his admission. Juanda Crumble reports pt to have a MH hx of depression and severe anxiety since his mom left the family when he was  younger. Juanda Crumble denied a family hx of MH/SA but reports pt to have an "extreme "addiction to alcohol. Charles denied pt to use any other substances. Charles confirmed pt to have a hx of hallucinating and seizures that come and go and stated "I usually can sit with him until it passes but this is the most extreme I have ever seen with him". Charles expressed concern with pt's MH and need for medication, INPT and/or OPT. Juanda Crumble denies pt to endorse SI/HI and identified pt's current PCP [facility]). Charles expressed plans to return to the hospital to spend overnight with pt until he gets the help he needs. Juanda Crumble reported no other concerns.   Disposition is pending psych consult   Diagnosis: Per hx Alcohol Abuse   Past Medical History:  Past Medical History:  Diagnosis Date  . Anxiety   . ETOH abuse   . Panic disorder   . Seizures ([place])     No past surgical history on file.  Family History: No family history on file.  Social History:  reports that he has been smoking cigarettes. He has never used smokeless tobacco. He reports current alcohol use. He reports that he does not use drugs.  Additional Social History:  Alcohol / Drug Use Pain Medications: see mar Prescriptions: see mar Over the Counter: see mar History of alcohol / drug use?: Yes Substance #1 Name of Substance 1: alcohol  CIWA: CIWA-Ar BP: 126/76 Pulse Rate: 96 Nausea and Vomiting: no nausea and no vomiting Tactile Disturbances:  severe hallucinations Tremor: five Auditory Disturbances: moderate harshness or ability to frighten Paroxysmal Sweats: barely perceptible sweating, palms moist Visual Disturbances: moderately severe hallucinations Anxiety: moderately anxious, or guarded, so anxiety is inferred Headache, Fullness in Head: none present Agitation: moderately fidgety and restless Orientation and Clouding of Sensorium: disoriented for data by no more than 2 calendar days CIWA-Ar Total: 28 COWS:     Allergies: No Known Allergies  Home Medications: (Not in a hospital admission)   OB/GYN Status:  No LMP for male patient.  General Assessment Data Location of Assessment: [facility] ED TTS Assessment: In system Is this a Tele or Face-to-Face Assessment?: Face-to-Face Is this an Initial Assessment or a Re-assessment for this encounter?: Initial Assessment Patient Accompanied by:: N/A Language Other than English: No Living Arrangements: Other (Comment) (Private) What gender do you identify as?: Male Marital status: Single Maiden name: n/a Pregnancy Status: No Living Arrangements: Parent (dad ) Can pt return to current living arrangement?: Yes Admission Status: Voluntary Is patient capable of signing voluntary admission?: Yes Referral Source: Self/Family/Friend Insurance type: None   Medical Screening Exam [facility] Walk-in ONLY) Medical Exam completed: Yes  Crisis Care Plan Living Arrangements: Parent (dad ) Legal Guardian:  (self) Name of Psychiatrist: [facility] Name of Therapist: [facility]  Education Status Is patient currently in school?: No Is the patient employed, unemployed or receiving disability?: Unemployed  Risk to self with the past 6 months Suicidal Ideation: No (UTA) Has patient been a risk to self within the past 6 months prior to admission? : No Suicidal Intent: No Has patient had any suicidal intent within the past 6 months prior to admission? : No Is patient at risk for suicide?: No, but patient needs Medical Clearance Suicidal Plan?: No Has patient had any suicidal plan within the past 6 months prior to admission? : No Access to Means: No What has been your use of drugs/alcohol within the last 12 months?: Alcohol  Previous Attempts/Gestures: No How many times?: 0 Other Self Harm Risks: None reported  Triggers for Past Attempts: None known Intentional Self Injurious Behavior: None Family Suicide History: Unknown Recent stressful life event(s):  Other (Comment) (Nothing recently ) Persecutory voices/beliefs?: Yes Depression: Yes Depression Symptoms: Insomnia Substance abuse history and/or treatment for substance abuse?: Yes Suicide prevention information given to non-admitted patients: Not applicable  Risk to Others within the past 6 months Homicidal Ideation: No Does patient have any lifetime risk of violence toward others beyond the six months prior to admission? : No Thoughts of Harm to Others: No Current Homicidal Intent: No Current Homicidal Plan: No Access to Homicidal Means: No Describe Access to Homicidal Means: n/a Identified Victim: n/a History of harm to others?: No Assessment of Violence: None Noted Violent Behavior Description: n/a Does patient have access to weapons?: No Criminal Charges Pending?:  (UTA) Does patient have a court date:  (UTA) Is patient on probation?: Unknown  Psychosis Hallucinations: Auditory, Visual Delusions: Unspecified  Mental Status Report Appearance/Hygiene: Unremarkable Eye Contact: Fair Motor Activity: Freedom of movement, Unsteady Speech: Incoherent, Slurred, Tangential Level of Consciousness: Alert, Other (Comment) (Possibly Seizing 2-3 mins in/out) Mood: Depressed, Anxious, Preoccupied Affect: Anxious, Depressed, Preoccupied Anxiety Level: Moderate Thought Processes: Tangential, Thought Blocking Judgement: Impaired Orientation: Not oriented Obsessive Compulsive Thoughts/Behaviors: None  Cognitive Functioning Concentration: Poor Memory: Remote Impaired, Recent Impaired Is patient IDD: No Insight: Poor Impulse Control: Fair Appetite: Good Have you had any weight changes? : No Change Sleep: Decreased Total Hours of Sleep:  (UTA) Vegetative Symptoms:  None     Prior Inpatient Therapy Prior Inpatient Therapy: Yes Prior Therapy Dates: 07/27/2015 Prior Therapy Facilty/Provider(s): [facility] Reason for Treatment: Alcohol Abuse  Prior Outpatient Therapy Prior  Outpatient Therapy: No Does patient have an ACCT team?: No Does patient have Intensive In-House Services?  : No Does patient have [facility] services? : Unknown Does patient have P4CC services?: Unknown  ADL Screening (condition at time of admission) Is the patient deaf or have difficulty hearing?: No Does the patient have difficulty seeing, even when wearing glasses/contacts?: No Does the patient have difficulty concentrating, remembering, or making decisions?: No Does the patient have difficulty dressing or bathing?: No Does the patient have difficulty walking or climbing stairs?: No Weakness of Legs: None Weakness of Arms/Hands: None  Home Assistive Devices/Equipment Home Assistive Devices/Equipment: None  Therapy Consults (therapy consults require a physician order) PT Evaluation Needed: No OT Evalulation Needed: No SLP Evaluation Needed: No Abuse/Neglect Assessment (Assessment to be complete while patient is alone) Abuse/Neglect Assessment Can Be Completed: Yes Physical Abuse: Denies Verbal Abuse: Denies Sexual Abuse: Denies Exploitation of patient/patient's resources: Denies Self-Neglect: Denies Values / Beliefs Cultural Requests During Hospitalization: None Spiritual Requests During Hospitalization: None Consults Spiritual Care Consult Needed: No Transition of Care Team Consult Needed: No Advance Directives (For Healthcare) Does Patient Have a Medical Advance Directive?: No Would patient like information on creating a medical advance directive?: No - Patient declined          Disposition:  Disposition Initial Assessment Completed for this Encounter: Yes  On Site Evaluation by:   Reviewed with Physician:    Opal Sidles 11/13/2019 6:00 PM

## 2019-11-14 ENCOUNTER — Other Ambulatory Visit: Payer: Self-pay

## 2019-11-14 ENCOUNTER — Encounter: Payer: Self-pay | Admitting: Internal Medicine

## 2019-11-14 DIAGNOSIS — F1910 Other psychoactive substance abuse, uncomplicated: Secondary | ICD-10-CM

## 2019-11-14 DIAGNOSIS — E871 Hypo-osmolality and hyponatremia: Secondary | ICD-10-CM | POA: Insufficient documentation

## 2019-11-14 DIAGNOSIS — E876 Hypokalemia: Secondary | ICD-10-CM | POA: Insufficient documentation

## 2019-11-14 LAB — PROTIME-INR
INR: 1 (ref 0.8–1.2)
Prothrombin Time: 12.8 seconds (ref 11.4–15.2)

## 2019-11-14 LAB — CBC
HCT: 34.4 % — ABNORMAL LOW (ref 39.0–52.0)
Hemoglobin: 11.9 g/dL — ABNORMAL LOW (ref 13.0–17.0)
MCH: 32.6 pg (ref 26.0–34.0)
MCHC: 34.6 g/dL (ref 30.0–36.0)
MCV: 94.2 fL (ref 80.0–100.0)
Platelets: 85 10*3/uL — ABNORMAL LOW (ref 150–400)
RBC: 3.65 MIL/uL — ABNORMAL LOW (ref 4.22–5.81)
RDW: 11.4 % — ABNORMAL LOW (ref 11.5–15.5)
WBC: 4 10*3/uL (ref 4.0–10.5)
nRBC: 0 % (ref 0.0–0.2)

## 2019-11-14 LAB — COMPREHENSIVE METABOLIC PANEL
ALT: 28 U/L (ref 0–44)
AST: 29 U/L (ref 15–41)
Albumin: 3.7 g/dL (ref 3.5–5.0)
Alkaline Phosphatase: 43 U/L (ref 38–126)
Anion gap: 6 (ref 5–15)
BUN: 8 mg/dL (ref 6–20)
CO2: 27 mmol/L (ref 22–32)
Calcium: 8.5 mg/dL — ABNORMAL LOW (ref 8.9–10.3)
Chloride: 98 mmol/L (ref 98–111)
Creatinine, Ser: 0.64 mg/dL (ref 0.61–1.24)
GFR calc Af Amer: >60 mL/min (ref >60)
GFR calc non Af Amer: >60 mL/min (ref >60)
Glucose, Bld: 91 mg/dL (ref 70–99)
Potassium: 4.4 mmol/L (ref 3.5–5.1)
Sodium: 131 mmol/L — ABNORMAL LOW (ref 135–145)
Total Bilirubin: 2.2 mg/dL — ABNORMAL HIGH (ref 0.3–1.2)
Total Protein: 6.5 g/dL (ref 6.5–8.1)

## 2019-11-14 LAB — GLUCOSE, CAPILLARY: Glucose-Capillary: 83 mg/dL (ref 70–99)

## 2019-11-14 LAB — PHOSPHORUS: Phosphorus: 3.3 mg/dL (ref 2.5–4.6)

## 2019-11-14 LAB — HIV ANTIBODY (ROUTINE TESTING W REFLEX): HIV Screen 4th Generation wRfx: NONREACTIVE

## 2019-11-14 LAB — MRSA PCR SCREENING: MRSA by PCR: NEGATIVE

## 2019-11-14 LAB — MAGNESIUM: Magnesium: 2.3 mg/dL (ref 1.7–2.4)

## 2019-11-14 MED ORDER — LORAZEPAM 2 MG/ML IJ SOLN: 1.0000 mg | INTRAMUSCULAR | Status: DC | PRN

## 2019-11-14 MED ORDER — POTASSIUM CHLORIDE IN NACL 20-0.9 MEQ/L-% IV SOLN
INTRAVENOUS | Status: DC
  Filled 2019-11-14: qty 1000

## 2019-11-14 MED ORDER — LEVETIRACETAM 750 MG PO TABS
750.0000 mg | ORAL_TABLET | Freq: Two times a day (BID) | ORAL | Status: DC
  Administered 2019-11-14 – 2019-11-15 (×3): 750 mg via ORAL
  Filled 2019-11-14 (×5): qty 1

## 2019-11-14 MED ORDER — LACTATED RINGERS IV SOLN: INTRAVENOUS | Status: DC

## 2019-11-14 MED ORDER — ALPRAZOLAM 0.5 MG PO TABS
1.0000 mg | ORAL_TABLET | Freq: Three times a day (TID) | ORAL | Status: DC
  Administered 2019-11-14 – 2019-11-15 (×5): 1 mg via ORAL
  Filled 2019-11-14 (×5): qty 2

## 2019-11-14 MED ORDER — CHLORHEXIDINE GLUCONATE CLOTH 2 % EX PADS
6.0000 | MEDICATED_PAD | Freq: Every day | CUTANEOUS | Status: DC
  Administered 2019-11-14: 6 via TOPICAL
  Filled 2019-11-14: qty 6

## 2019-11-14 NOTE — ED Notes (Signed)
Report given to covering nurse in ccu unit Lauren RN. Patient to unit via stretcher/nurse. Father to follow.

## 2019-11-14 NOTE — ED Notes (Signed)
PT continues resting in bed at this time. NAD. Sitter reports pt woke for a short period of time to ask where he was and then fell back to sleep.

## 2019-11-14 NOTE — ED Notes (Signed)
First attempt to call report. As per charge nurse awaiting to discharge a patient on unit before he can assign this patient to a nurse. Will call ed when ready for patient report.

## 2019-11-14 NOTE — Progress Notes (Signed)
Patient said he started drinking when he was 34 years old and that it helped with his anxiety and panic attacks. Currently having difficulty getting his prescriptions due to financial situations. To get a script he needs to see his provider but the cost for the visit is more than he can afford. If able, send patient home with scripts.

## 2019-11-14 NOTE — ED Notes (Signed)
hospitalist at bedside to assess. Plan is to get patient off the drip and start po meds.

## 2019-11-14 NOTE — Progress Notes (Signed)
PROGRESS NOTE    Micheal [name]  TIW:[id number] DOB: Jun 24, 1985 DOA: 11/13/2019 PCP: Patient, No Pcp Per   Brief Narrative:  34 y/o M w/ PMH of alcohol abuse, smoker who presents w/ anxiety. Pt is a poor historian. Pt is oriented to person, year only. Pt drinks 2-3 times a week approx 1-2 40z of smirnoff ice, couple of beers & 5th of vodka. Pt also admitted to smoking marijuana today and thinks his friend put something else in the joint.  Patient was placed on CIWA protocol initially and then Precedex infusion was started due to worsening delirium.  Subjective: Very somnolent when seen today.  Denies any complaint .  Assessment & Plan:   Active Problems:   Alcohol withdrawal (HCC)  Acute metabolic encephalopathy/Alcohol withdrawal/intoxication.  Ethanol level less than 10.  Drug screen still pending.  Start the labs within normal limit. -Continue to monitor. -Continue with folic acid and thiamine. -Continue with CIWA protocol. -Appreciate PCCM help with management of Precedex infusion.  Electrolyte abnormalities.  Presentation patient had hyponatremia and hypokalemia.  Hypokalemia resolved.  Hyponatremia improving.  Most likely secondary to dehydration.  -Continue with IV fluid.  Hyperbilirubinemia.  T bili at 2.2.  Most likely secondary to alcohol abuse. -Continue to monitor with supportive care.  Thrombocytopenia.  Most likely secondary to alcohol abuse -Continue to monitor.  Objective: Vitals:   11/14/19 0530 11/14/19 0600 11/14/19 0720 11/14/19 1011  BP: 109/72 108/68 110/69 118/64  Pulse: 63 65 68 66  Resp: 16 17 18 17   Temp:   98.6 F (37 C) 98.6 F (37 C)  TempSrc:   Axillary Axillary  SpO2: 96% 93% 98% 98%  Weight:      Height:        Intake/Output Summary (Last 24 hours) at 11/14/2019 1641 Last data filed at 11/14/2019 1500 Gross per 24 hour  Intake 2144.36 ml  Output 950 ml  Net 1194.36 ml   Filed Weights   11/13/19 1016  Weight: 68 kg     Examination:  General exam: Appears somnolent but calm and comfortable  Respiratory system: Clear to auscultation. Respiratory effort normal. Cardiovascular system: S1 & S2 heard, RRR. No JVD, murmurs, rubs, gallops or clicks. Gastrointestinal system: Soft, nontender, nondistended, bowel sounds positive. Central nervous system: Alert and oriented. No focal neurological deficits. Extremities: No edema, no cyanosis, pulses intact and symmetrical. Psychiatry: Judgement and insight appear impaired.  DVT prophylaxis: Lovenox Code Status: Full Family Communication: No family at bedside Disposition Plan:  Status is: Inpatient  Remains inpatient appropriate because:Inpatient level of care appropriate due to severity of illness   Dispo: The patient is from: Home              Anticipated d/c is to: Home              Anticipated d/c date is: 1 day              Patient currently is not medically stable to d/c.   Consultants:   PCCM  Procedures:  Antimicrobials:   Data Reviewed: I have personally reviewed following labs and imaging studies  CBC: Recent Labs  Lab 11/13/19 1022 11/14/19 0500  WBC 6.0 4.0  NEUTROABS 4.2  --   HGB 13.1 11.9*  HCT 36.1* 34.4*  MCV 88.7 94.2  PLT 109* 85*   Basic Metabolic Panel: Recent Labs  Lab 11/13/19 1022 11/13/19 1410 11/13/19 2112 11/14/19 0500  NA 123* 124* 131* 131*  K 2.5* 2.6* 2.3* 4.4  CL 83* 82*  --  98  CO2 27 30  --  27  GLUCOSE 114* 122*  --  91  BUN 15 13  --  8  CREATININE 0.76 0.73  --  0.64  CALCIUM 9.3 9.1  --  8.5*  MG 2.0  --  2.4 2.3  PHOS  --   --  3.0 3.3   GFR: Estimated Creatinine Clearance: 124 mL/min (by C-G formula based on SCr of 0.64 mg/dL). Liver Function Tests: Recent Labs  Lab 11/13/19 1022 11/14/19 0500  AST 35 29  ALT 39 28  ALKPHOS 51 43  BILITOT 2.3* 2.2*  PROT 7.5 6.5  ALBUMIN 4.6 3.7   Recent Labs  Lab 11/13/19 1022  LIPASE 30   No results for input(s): AMMONIA in the last  168 hours. Coagulation Profile: Recent Labs  Lab 11/14/19 1102  INR 1.0   Cardiac Enzymes: No results for input(s): CKTOTAL, CKMB, CKMBINDEX, TROPONINI in the last 168 hours. BNP (last 3 results) No results for input(s): PROBNP in the last 8760 hours. HbA1C: No results for input(s): HGBA1C in the last 72 hours. CBG: No results for input(s): GLUCAP in the last 168 hours. Lipid Profile: No results for input(s): CHOL, HDL, LDLCALC, TRIG, CHOLHDL, LDLDIRECT in the last 72 hours. Thyroid Function Tests: No results for input(s): TSH, T4TOTAL, FREET4, T3FREE, THYROIDAB in the last 72 hours. Anemia Panel: No results for input(s): VITAMINB12, FOLATE, FERRITIN, TIBC, IRON, RETICCTPCT in the last 72 hours. Sepsis Labs: No results for input(s): PROCALCITON, LATICACIDVEN in the last 168 hours.  Recent Results (from the past 240 hour(s))  SARS Coronavirus 2 by RT PCR (hospital order, performed in [facility] hospital lab) Nasopharyngeal Nasopharyngeal Swab     Status: None   Collection Time: 11/13/19 10:22 AM   Specimen: Nasopharyngeal Swab  Result Value Ref Range Status   SARS Coronavirus 2 NEGATIVE NEGATIVE Final    Comment: (NOTE) SARS-CoV-2 target nucleic acids are NOT DETECTED.  The SARS-CoV-2 RNA is generally detectable in upper and lower respiratory specimens during the acute phase of infection. The lowest concentration of SARS-CoV-2 viral copies this assay can detect is 250 copies / mL. A negative result does not preclude SARS-CoV-2 infection and should not be used as the sole basis for treatment or other patient management decisions.  A negative result may occur with improper specimen collection / handling, submission of specimen other than nasopharyngeal swab, presence of viral mutation(s) within the areas targeted by this assay, and inadequate number of viral copies (<250 copies / mL). A negative result must be combined with clinical observations, patient history, and  epidemiological information.  Fact Sheet for Patients:   StrictlyIdeas.no  Fact Sheet for Healthcare Providers: BankingDealers.co.za  This test is not yet approved or  cleared by the [place] FDA and has been authorized for detection and/or diagnosis of SARS-CoV-2 by FDA under an Emergency Use Authorization (EUA).  This EUA will remain in effect (meaning this test can be used) for the duration of the COVID-19 declaration under Section 564(b)(1) of the Act, 21 U.S.C. section 360bbb-3(b)(1), unless the authorization is terminated or revoked sooner.  Performed at [facility], [address], [place], [place] [address]      Radiology Studies: No results found.  Scheduled Meds: . ALPRAZolam  1 mg Oral TID  . enoxaparin (LOVENOX) injection  40 mg Subcutaneous Q24H  . folic acid  1 mg Oral Daily  . levETIRAcetam  750 mg Oral BID  .  thiamine  100 mg Oral Daily   Or  . thiamine  100 mg Intravenous Daily   Continuous Infusions: . 0.9 % NaCl with KCl 20 mEq / L 75 mL/hr at 11/14/19 1139     LOS: 1 day   Time spent: 40 minutes.  Arnetha Courser, MD [facility]  If 7PM-7AM, please contact night-coverage [email or web address]  11/14/2019, 4:41 PM   This record has been created using Conservation officer, historic buildings. Errors have been sought and corrected,but may not always be located. Such creation errors do not reflect on the standard of care.

## 2019-11-14 NOTE — ED Notes (Signed)
Lunch provided. Parent at bedside.

## 2019-11-14 NOTE — ED Notes (Signed)
Pt resting in bed with visible sweat and shaking when arm extended. Ativan administered and pt continues to rest in bed.

## 2019-11-14 NOTE — Consult Note (Signed)
Name: TABARI VOLKERT MRN: [id number] DOB: 09/18/1985    ADMISSION DATE:  11/13/2019 CONSULTATION DATE: 11/13/2019  REFERRING MD : Dr. Mayford Knife   CHIEF COMPLAINT: Anxiety   BRIEF PATIENT DESCRIPTION:  34 yo male admitted with ETOH withdrawal requiring precedex gtt   SIGNIFICANT EVENTS/STUDIES:  06/22: Pt admitted to the stepdown unit requiring precedex gtt   HISTORY OF PRESENT ILLNESS:   This is a 34 yo male with a PMH of Seizures, Panic Disorder, ETOH Abuse, and Anxiety.  He presented to [facility] ER on 06/22 with c/o anxiety.  Per ER notes the pt does have a hx of ETOH abuse, and reported he binged drank 1 week prior to ER presentation. But he did not drink alcohol again until 06/21 when he drank 1 beer and smoked marijuana with his neighbor.  He proceeded to the emergency room due to concern the marijuana was laced with something because he developed severe anxiety/hallucinations after smoking it.  In the ER pt intermittently confused and attempted to leave against medical advise a few times.  Lab results revealed Na+ 123, K+ 2.5, chloride 83, glucose 114, platelets 109, alcohol level <10, and COVID-19 negative.  Pts family arrived at bedside and the pt agreed to hospital admission for treatment.  Due to worsening agitation/dilirium precedex gtt initiated.  Pt admitted to the stepdown unit per hospitalist team and PCCM team consulted to assist with treatment plan.    PAST MEDICAL HISTORY :   has a past medical history of Anxiety, ETOH abuse, Panic disorder, and Seizures (HCC).  has no past surgical history on file. Prior to Admission medications   Medication Sig Start Date End Date Taking? Authorizing Provider  acetaminophen (TYLENOL) 325 MG tablet Take 650 mg by mouth every 6 (six) hours as needed.   Yes [provider]   No Known Allergies  FAMILY HISTORY:  family history is not on file. SOCIAL HISTORY:  reports that he has been smoking cigarettes. He has never used  smokeless tobacco. He reports current alcohol use. He reports that he does not use drugs.  REVIEW OF SYSTEMS:   Unable to assess pt sedated   SUBJECTIVE:  Unable to assess pt sedated   VITAL SIGNS: Temp:  [98.6 F (37 C)] 98.6 F (37 C) (06/23 1011) Pulse Rate:  [63-110] 66 (06/23 1011) Resp:  [10-19] 17 (06/23 1011) BP: (93-158)/(64-96) 118/64 (06/23 1011) SpO2:  [93 %-100 %] 98 % (06/23 1011)  PHYSICAL EXAMINATION: General: NAD resting in bed  Neuro: awake , speaking in full sentences PERRL HEENT: supple, no JVD  Cardiovascular: sinus rhythm, rrr, no R/G  Lungs: clear throughout, even, non labored  Abdomen: +BS x4, soft, non distended  Musculoskeletal: normal bulk and tone, no edema  Skin: intact no rashes or lesions present  Nero no grossly appreciable FNDs with mild anxiety  Recent Labs  Lab 11/13/19 1022 11/13/19 1022 11/13/19 1410 11/13/19 2112 11/14/19 0500  NA 123*   < > 124* 131* 131*  K 2.5*   < > 2.6* 2.3* 4.4  CL 83*  --  82*  --  98  CO2 27  --  30  --  27  BUN 15  --  13  --  8  CREATININE 0.76  --  0.73  --  0.64  GLUCOSE 114*  --  122*  --  91   < > = values in this interval not displayed.   Recent Labs  Lab 11/13/19 1022 11/14/19 0500  HGB 13.1 11.9*  HCT 36.1* 34.4*  WBC 6.0 4.0  PLT 109* 85*   No results found.  ASSESSMENT / PLAN:  Acute encephalopathy secondary to ETOH withdrawal  Hyponatremia secondary to dehydration  Hypokalemia  Thrombocytopenia  Continuous telemetry monitoring  Trend BMP  Replace electrolytes as indicated  Monitor UOP  Continue NS @75  ml/hr  Continue precedex gtt and CIWA protocol  Continue folic acid, thiamine, and mvi Sitter at bedside  Urine drug screen pending  Psychiatry consulted appreciate input  VTE px: subq lovenox Trend CBC  Monitor for s/sx of bleeding and transfuse for hgb <7   Critical care provider statement:    Critical care time (minutes):  33   Critical care time was exclusive of:   Separately billable procedures and  treating other patients   Critical care was necessary to treat or prevent imminent or  life-threatening deterioration of the following conditions:  Generalized seizures, alcoholism, hyponatremia, electrolyte derrangments   Critical care was time spent personally by me on the following  activities:  Development of treatment plan with patient or surrogate,  discussions with consultants, evaluation of patient's response to  treatment, examination of patient, obtaining history from patient or  surrogate, ordering and performing treatments and interventions, ordering  and review of laboratory studies and re-evaluation of patient's condition   I assumed direction of critical care for this patient from another  provider in my specialty: no       Ottie Glazier, M.D.  Pulmonary & [place]

## 2019-11-14 NOTE — Progress Notes (Signed)
Pt arrived from ED Alert and oriented. NSR, RA, No Drips, vitals WNL. Currently eating dinner and watching tv with father at bedside.

## 2019-11-14 NOTE — ED Notes (Signed)
ICU called for report. Receiving nurse unable to take at present time. Will call back in 10 minutes.

## 2019-11-14 NOTE — ED Notes (Signed)
Father at bedside.

## 2019-11-14 NOTE — Plan of Care (Signed)

## 2019-11-14 NOTE — ED Notes (Signed)
Father at bedside to visit. Patient awake and alert. Calm and cooperative. Labs drawn and sent, vss. Safety maintained. Will continue to monitor.

## 2019-11-15 LAB — COMPREHENSIVE METABOLIC PANEL
ALT: 27 U/L (ref 0–44)
AST: 22 U/L (ref 15–41)
Albumin: 4.1 g/dL (ref 3.5–5.0)
Alkaline Phosphatase: 46 U/L (ref 38–126)
Anion gap: 8 (ref 5–15)
BUN: 8 mg/dL (ref 6–20)
CO2: 28 mmol/L (ref 22–32)
Calcium: 8.9 mg/dL (ref 8.9–10.3)
Chloride: 99 mmol/L (ref 98–111)
Creatinine, Ser: 0.71 mg/dL (ref 0.61–1.24)
GFR calc Af Amer: >60 mL/min (ref >60)
GFR calc non Af Amer: >60 mL/min (ref >60)
Glucose, Bld: 96 mg/dL (ref 70–99)
Potassium: 3.6 mmol/L (ref 3.5–5.1)
Sodium: 135 mmol/L (ref 135–145)
Total Bilirubin: 0.7 mg/dL (ref 0.3–1.2)
Total Protein: 6.8 g/dL (ref 6.5–8.1)

## 2019-11-15 LAB — CBC
HCT: 36.2 % — ABNORMAL LOW (ref 39.0–52.0)
Hemoglobin: 12.3 g/dL — ABNORMAL LOW (ref 13.0–17.0)
MCH: 32.9 pg (ref 26.0–34.0)
MCHC: 34 g/dL (ref 30.0–36.0)
MCV: 96.8 fL (ref 80.0–100.0)
Platelets: 114 10*3/uL — ABNORMAL LOW (ref 150–400)
RBC: 3.74 MIL/uL — ABNORMAL LOW (ref 4.22–5.81)
RDW: 11.6 % (ref 11.5–15.5)
WBC: 4.8 10*3/uL (ref 4.0–10.5)
nRBC: 0 % (ref 0.0–0.2)

## 2019-11-15 MED ORDER — THIAMINE HCL 100 MG PO TABS: 100.0000 mg | ORAL_TABLET | Freq: Every day | ORAL | 0 refills | Status: AC

## 2019-11-15 MED ORDER — GABAPENTIN 100 MG PO CAPS: 100.0000 mg | ORAL_CAPSULE | Freq: Three times a day (TID) | ORAL | 0 refills | Status: DC

## 2019-11-15 MED ORDER — GABAPENTIN 100 MG PO CAPS
100.0000 mg | ORAL_CAPSULE | Freq: Three times a day (TID) | ORAL | Status: DC
  Administered 2019-11-15: 100 mg via ORAL
  Filled 2019-11-15: qty 1

## 2019-11-15 MED ORDER — ALPRAZOLAM 1 MG PO TABS: 0.5000 mg | ORAL_TABLET | Freq: Two times a day (BID) | ORAL | 0 refills | Status: DC | PRN

## 2019-11-15 MED ORDER — FOLIC ACID 1 MG PO TABS: 1.0000 mg | ORAL_TABLET | Freq: Every day | ORAL | 0 refills | Status: AC

## 2019-11-15 NOTE — Plan of Care (Signed)

## 2019-11-15 NOTE — Progress Notes (Signed)
Pt VS stable. Pt assessment WNL. Pt to be discharged home with referral to psychiatry and behavioral health. Pt given after visit summary. Pt transported by wheelchair to front of hospital. Pt entered car safely.
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
## 2019-11-15 NOTE — Discharge Instructions (Signed)
Alcohol Withdrawal Syndrome When a person who drinks a lot of alcohol stops drinking, he or she may have unpleasant and serious symptoms. These symptoms are called alcohol withdrawal syndrome. This condition may be mild or severe. It can be life-threatening. It can cause:  Shaking that you cannot control (tremor).  Sweating.  Headache.  Feeling fearful, upset, grouchy, or depressed.  Trouble sleeping (insomnia).  Nightmares.  Fast or uneven heartbeats (palpitations).  Alcohol cravings.  Feeling sick to your stomach (nausea).  Throwing up (vomiting).  Being bothered by light and sounds.  Confusion.  Trouble thinking clearly.  Not being hungry (loss of appetite).  Big changes in mood (mood swings). If you have all of the following symptoms at the same time, get help right away:  High blood pressure.  Fast heartbeat.  Trouble breathing.  Seizures.  Seeing, hearing, feeling, smelling, or tasting things that are not there (hallucinations). These symptoms are known as delirium tremens (DTs). They must be treated at the hospital right away. Follow these instructions at home:   Take over-the-counter and prescription medicines only as told by your doctor. This includes vitamins.  Do not drink alcohol.  Do not drive until your doctor says that this is safe for you.  Have someone stay with you or be available in case you need help. This should be someone you trust. This person can help you with your symptoms. He or she can also help you to not drink.  Drink enough fluid to keep your pee (urine) pale yellow.  Think about joining a support group or a treatment program to help you stop drinking.  Keep all follow-up visits as told by your doctor. This is important. Contact a doctor if:  Your symptoms get worse.  You cannot eat or drink without throwing up.  You have a hard time not drinking alcohol.  You cannot stop drinking alcohol. Get help right away  if:  You have fast or uneven heartbeats.  You have chest pain.  You have trouble breathing.  You have a seizure for the first time.  You see, hear, feel, smell, or taste something that is not there.  You get very confused. Summary  When a person who drinks a lot of alcohol stops drinking, he or she may have serious symptoms. This is called alcohol withdrawal syndrome.  Delirium tremens (DTs) is a group of life-threatening symptoms. You should get help right away if you have these symptoms.  Think about joining an alcohol support group or a treatment program. This information is not intended to replace advice given to you by your health care provider. Make sure you discuss any questions you have with your health care provider. Document Revised: 04/22/2017 Document Reviewed: 01/14/2017 Elsevier Patient Education  2020 [employer].   Delirium Delirium is a state of mental confusion. It comes on quickly and causes significant changes in a person's thinking and behavior. People with delirium usually have trouble paying attention to what is going on or knowing where they are. They may become very withdrawn or very emotional and unable to sit still. They may even see or feel things that are not there (hallucinations). Delirium is a sign of a serious underlying medical condition. What are the causes? Delirium occurs when something suddenly affects the signals that the brain sends out. Brain signals can be affected by anything that puts severe stress on the body and brain and causes brain chemicals to be out of balance. The most common causes of delirium  include:  Infections. These may be bacterial, viral, fungal, or protozoal.  Medicines. These include many over-the-counter and prescription medicines.  Recreational drugs.  Substance withdrawal. This occurs with sudden discontinuation of alcohol, certain medicines, or recreational drugs.  Surgery and anesthesia.  Sudden vascular  events, such as stroke and brain hemorrhage.  Other brain disorders, such as migraines, tumors, seizures, and physical head trauma.  Metabolic disorders, such as kidney or liver failure.  Low blood oxygen (anoxia). This may occur with lung disease, cardiac arrest, or carbon monoxide poisoning.  Hormone imbalances (endocrinopathies), such as an overactive thyroid (hyperthyroidism) or underactive thyroid (hypothyroidism).  Vitamin deficiencies. What increases the risk? The following factors may make someone more likely to develop this condition.  Being a child.  Being an older person.  Living alone.  Having vision loss or hearing loss.  Having an existing brain disease, such as dementia.  Having long-lasting (chronic) medical conditions, such as heart disease.  Being hospitalized for long periods of time. What are the signs or symptoms? Delirium starts with a sudden change in a person's thinking or behavior. Symptoms include:  Not being able to stay awake (drowsiness) or pay attention.  Being confused about places, time, and people.  Forgetfulness.  Having extreme energy levels. These may be low or high.  Changes in sleep patterns.  Extreme mood swings, such as sudden anger or anxiety.  Focusing on things or ideas that are not important.  Rambling and senseless talking.  Difficulty speaking, understanding speech, or both.  Hallucinations.  Tremor or unsteady gait. Symptoms come and go (fluctuate) over time, and they are often worse at the end of the day. How is this diagnosed? People with delirium may not realize that they have the condition. Often, a family member or health care provider is the first person to notice the changes. This condition may be diagnosed based on a physical exam, health history, and tests.  The health care provider will obtain a detailed history. This may include questions about: ? Current symptoms. ? Medical  issues. ? Medicines. ? Recreational drug use.  The health care provider will perform a mental status examination by: ? Asking questions to check for confusion. ? Watching for abnormal behavior.  The health care provider may also order lab tests or additional studies to determine the cause of the delirium. How is this treated? Treatment of delirium depends on the cause and severity. Delirium usually goes away within days or weeks of treating the underlying cause. In the meantime, do not leave the person alone because he or she may accidentally cause self-harm. This condition may be treated with supportive care, such as:  Increased light during the day and decreased light at night.  Low noise level.  Uninterrupted sleep.  A regular daily schedule.  Clocks and calendars to help with orientation.  Familiar objects, including the person's pictures and clothing.  Frequent visits from familiar family and friends.  A healthy diet.  Gentle exercise. In more severe cases of delirium, medicine may be prescribed to help the person keep calm and think more clearly. Follow these instructions at home:  Continue supportive care as told by a health care provider.  Over-the-counter and prescription medicines should be taken only as told by a health care provider.  Ask a health care provider before using herbs or supplements.  Do not use alcohol or recreational drugs.  Keep all follow-up visits as told by a health care provider. This is important. Contact a health care  provider if:  Symptoms do not get better or they become worse.  New symptoms of delirium develop.  Caring for the person at home does not seem safe.  Eating, drinking, or communicating stops.  There are side effects of medicines, such as changes in sleep patterns, dizziness, weight gain, restlessness, movement changes, or tremors. Get help right away if:  Serious thoughts occur about self-harm or about hurting  others.  There are serious side effects of medicine, such as: ? Swelling of the face, lips, tongue, or throat. ? Fever, confusion, muscle spasms, or seizures. Summary  Delirium is a state of mental confusion. It comes on quickly and causes significant changes in a person's thinking and behavior.  Delirium is a sign of a serious underlying medical condition.  Certain medical conditions or a long hospital stay may increase the risk of developing delirium.  Treatment of delirium involves treating the underlying cause and providing supportive treatments, such as a calm and familiar environment. This information is not intended to replace advice given to you by your health care provider. Make sure you discuss any questions you have with your health care provider. Document Revised: 12/29/2017 Document Reviewed: 12/29/2017 Elsevier Patient Education  2020 [employer].   Finding Treatment for Addiction Addiction is a complex disease of the brain that causes an uncontrollable (compulsive) need for:  A substance. This includes alcohol, illegal drugs, or prescription medicines, such as painkillers.  An activity or behavior, such as gambling or shopping. Addiction changes the way your brain works. Because of this change:  The need for the medicine, drug, or activity can become so strong that you think about it all the time.  Getting more and more of your addiction becomes the most important thing to you.  You may find yourself leaving other activities and relationships to pursue your addiction.  You can become physically dependent on a substance.  Your health, behavior, emotions, and relationships can change for the worse. How do I know if I need treatment for addiction? Addiction is a progressive disease. Without treatment, addiction can get worse. Living with addiction puts you at higher risk for injury, poor health, loss of employment, loss of money, and even death. You might need  treatment for addiction if:  You have tried to stop or cut down, but you have not succeeded.  You find it annoying that your friends and family are concerned about your use or behavior.  You feel guilty about your use or behavior.  You need a particular substance or activity to start your day or to calm down.  You are running out of money because of your addiction.  You have done something illegal to support your addiction.  Your addiction has caused you: ? Health problems. ? Trouble in school, work, home, or with the police. ? To devote all your time to your addiction, and not to other responsibilities. ? To tell lies in order to hide your problem. What types of treatment are available? There may be options for treatment programs and plans based on your addiction, condition, needs, and preferences. No single treatment is right for everyone.  Treatment programs can be: ? Outpatient. You live at home and go to work or school, but you go to a clinic for treatment. ? Inpatient. You live and sleep at the program facility during treatment.  Programs may include: ? Medicine. You may need medicine to treat the addiction itself, or to treat anxiety or depression. ? Counseling and behavior therapy.  This can help individuals and families behave in healthier ways and relate more effectively. ? Support groups. Confidential group therapy, such as a 12-step program, can help individuals and families during treatment and recovery. ? A combination of education, counseling, and a 12-step, spirituality-based approach. What should I consider when selecting a treatment program? Think about your individual requirements when selecting a treatment program. Ask about:  The overall approach to treatment. ? Some programs are strictly 12-step programs. Some have a more flexible approach. ? Programs may differ in length of stay, setting, and size. ? Some programs include your family in your treatment plan.  Support may be offered to them throughout the treatment process, as well as instructions for them when you are discharged. ? You may continue to receive support after you have left the program.  The types of medical services that are offered. Find out if the program: ? Offers specific treatment for your particular addiction. ? Meets all of your needs, including physical and cultural needs. ? Includes any medicines you might need. ? Offers mental health counseling as part of your treatment. ? Offers the 12-step meetings at the center, or if transport is available for patients to attend meetings at other locations.  The cost and types of insurance that are accepted. ? Some programs are sponsored by the government. They support patients who do not have private insurance. ? If you do not have insurance, or if you choose to attend a program that does not accept your insurance, call the treatment center. Tell them your financial needs and whether a payment plan can be set up. ? There are also organizations that will help you find the resources for treatment. You can find them online by searching "treatment for addiction."  If the program is certified by the appropriate government agency. Where to find support  Your health care provider can help you to find the right treatment. These discussions are confidential.  The [employer] on Alcoholism and Drug Dependence (NCADD). This group has information about treatment centers and programs for people who have an addiction and for family members. ? Call: 1-800-NCA-CALL (934-786-6931). ? Visit the website: https://www.ncadd.org/  The Substance Abuse and Mental Health Services Administration [facility]). This organization will help you find publicly funded treatment centers, help hotlines, and counseling services near you. ? Call: 1-800-662-HELP (513-764-4185). ? Visit the website: www.findtreatment.RockToxic.pl  The National Problem Gambling  Helpline. This is a 24-hour confidential helpline for gambling addiction. ? Call: 316-409-3634 ? Visit the website: CocoaInvestor.tn In countries outside of the [place] and [place], look in [employer] for contact information for services in your area. Follow these instructions at home:  Find supportive people who will help you stay away from your addiction and stay sober.  Do not use the substance or engage in the activity.  If you have been through treatment: ? Follow your plan. The plan is usually developed by you and your health care provider during treatment. ? Go to meetings with other people in recovery. ? Avoid people, situations, and things that lead you to do the things you are addicted to (triggers). Summary  Addiction changes the way your brain works. These changes cause a desire to repeat and increase the use of the a substance or behavior.  Addiction is a progressive disease. Without treatment, addiction can get worse. Living with addiction puts you at higher risk for injury, poor health, loss of employment, loss of money, and even death.  There may be options for  treatment programs and plans based on your addiction, condition, needs, and preferences. No single treatment is right for everyone.  Your health care provider can help you to find the right treatment. These discussions are confidential. This information is not intended to replace advice given to you by your health care provider. Make sure you discuss any questions you have with your health care provider. Document Revised: 01/31/2019 Document Reviewed: 06/08/2017 Elsevier Patient Education  2020 [employer].   Delirium Tremens Delirium tremens, also called DTs, is the worst kind of alcohol withdrawal. It happens when you stop drinking or you drink less than usual. DTs is an emergency. You need to be treated in a hospital or a treatment center right away. What are the causes? DTs happens when you  drink a lot and often and then stop drinking. It can also happen if you drink less than you usually drink. What increases the risk? The more a person drinks and the longer he or she drinks, the greater the risk of DTs. This condition is more likely to develop in those who have a history of drinking heavily and:  Have had alcohol withdrawal or DTs in the past.  Have had a seizure during past alcohol withdrawal.  Are elderly.  Are pregnant.  Use drugs.  Take medicines to help you relax (sedatives).  Have medical problems, including: ? Infections. ? Heart, lung, or liver disease. ? Seizures. ? Behavioral health conditions. What are the signs or symptoms? Early symptoms of alcohol withdrawal happen before symptoms of DTs. These symptoms may:  Start within 6 hours after the most recent drink.  Last for 5-7 days. Early symptoms may include:  Problems sleeping  Problems thinking clearly.  Feeling worried or nervous (anxious).  Feeling moodier and more upset than usual.  Fast breathing.  Headache.  Feeling sick to your stomach (nauseous) and vomiting.  Fever.  Sweating.  A heartbeat that feels faster than normal.  A heartbeat that feels like it skips a beat or flutters.  Shaking (tremor) of a body part. Symptoms of DTs may:  Start to develop 3 days after the most recent drink.  Last for up to 8 days. Symptoms include:  Changes in attention and awareness.  Changes in heart rate, temperature, blood pressure, and oxygen levels. Most often, the heart rate and body temperature will rise.  Feeling or seeing things that are not really there (hallucinations).  Extreme tiredness and not being able to wake up.  Extreme confusion.  Seizures. How is this treated? Treatment for this condition is done in a hospital or treatment center. It may include:  Monitoring your temperature, heart rate, blood pressure, and oxygen.  Fluids and minerals given through an  IV.  Medicines to help you relax.  Medicines that help you worry less.  Medicines to control your blood pressure.  Medicines to prevent or control seizures.  Vitamins.  Nutrition by mouth or through an IV.  Medicines that help you feel less sick to your stomach.  Talking to a counselor. Follow these instructions at home: Medicines  Take over-the-counter and prescription medicines only as told by your doctor.  If you are prescribed medicines to help you relax, take them only as told by your doctor. Eating and drinking   Do not drink alcohol.  Drink enough fluid to keep your pee (urine) pale yellow. General instructions  Do not drive or use heavy machinery until your doctor approves.  If you drink a lot and often, do not  try to stop drinking without help from your doctor.  Have someone stay with you in case you need help.  Think about joining an alcohol support group.  Keep all follow-up visits as told by your doctor. This is important. Contact a doctor if:  You have symptoms of alcohol withdrawal that get worse or do not go away.  You feel sad (depressed).  You drink alcohol even though you were trying not to.  You cannot eat or drink without vomiting.  You are not able to stop drinking alcohol. Get help right away if:  You feel you may hurt yourself or others.  You have a seizure.  You have a fever.  You get very confused.  You feel or see things that are not there.  You are very sleepy and have trouble staying awake.  You vomit blood.  You shake or tremble very badly.  You sweat a lot.  You feel like your heart is beating too fast, skips a beat, or flutters.  You have chest pain.  You have trouble breathing. If you ever feel like you may hurt yourself or others, or have thoughts about taking your own life, get help right away. You can go to your nearest emergency department or call:  Your local emergency services (911 in the [place]  A  suicide crisis helpline, such as the National Suicide Prevention Lifeline at 57058581741-269-555-2581. This is open 24 hours a day. These symptoms may be an emergency. Do not wait to see if the symptoms will go away. Get medical help right away. Call your local emergency services (911 in the [place] Do not drive yourself to the hospital. Summary  DTs is the worst kind of alcohol withdrawal. It happens when you stop drinking or you drink less.  DTs can be life-threatening.  You need to be treated in a hospital or a treatment center right away.  The more a person drinks and the longer he or she drinks, the greater the risk of DTs.  Contact a doctor if you drink alcohol even though you were trying not to. This information is not intended to replace advice given to you by your health care provider. Make sure you discuss any questions you have with your health care provider. Document Revised: 05/29/2018 Document Reviewed: 05/29/2018 Elsevier Patient Education  2020 [employer]   Prescription Drug Misuse Information Prescription drug misuse happens when a medicine is used in a way that is different from how it was prescribed by the health care provider (non-medical reason). This includes:  Taking more of the medicine than is prescribed.  Taking another person's medicine.  Taking the medicine for another reason. The most common types of misused medicines are painkillers (such as opiates), depressants (tranquilizers and anti-anxiety drugs), and stimulants (amphetamines). Prescription drug misuse can cause:  Addiction.  Dangerous side effects.  Overdose, which can be deadly. How can drug misuse affect me? You may become addicted Misusing prescription medicine can lead to addiction. Addiction includes:  Having a need for the medicine, even though it is affecting your life.  Needing more and more of the medicine to get the same effect (tolerance).  Feeling sick when you do not take the medicine  (withdrawal). Misusing prescription medicine may require treatment for addiction. You may put your health in danger Misusing prescription medicines can put your health in danger. Misuse can:  Affect other medicines that you take and result in dangerous side effects.  Make you more likely to misuse other  drugs, like tobacco, alcohol, and marijuana.  Cause you to make poor decisions and engage in risky behavior. This can lead to problems at home, school, or work.  Cause you to overdose.  Affect your overall health. Misuse affects a pregnant woman's health and the health of her unborn baby. A baby can also be born with drug addiction and withdrawal symptoms. What are the effects of misusing medicines? The effects of misusing prescription medicines depend on the type of medicine you are misusing. They include: Opiates  Confusion.  Nausea.  Itching.  Difficulty passing stool (constipation).  A flu-like sickness during withdrawal.  An inability to breathe if an overdose occurs (respiratory depression). Depressants  Sleepiness.  Confusion.  Seizures if the medicine is stopped. Stimulants  Difficulty falling asleep or staying asleep (insomnia).  Loss of appetite.  Anxiety.  False fears or beliefs (paranoia).  Fatigue and depression during withdrawal.  Overdose. This can cause a dangerous high body temperature, heart failure, or a seizure. What actions can I take to prevent drug misuse? To prevent drug misuse:  Do not take another person's medicines.  Do not take a prescription medicine without a legal prescription.  Do not take more of a medicine than was prescribed.  Do not take a medicine in a different way than it was prescribed, such as snorting, crushing, or injecting.  Do not take a medicine to feel high, relaxed, or energized.  Do not take a stimulant to lose weight or improve mental alertness. Where to find more information  [employer] on Drug  Abuse: http://vang.com/  Foundation for a Drug-Free World: [place].org  Substance Abuse and Mental Health Services Administration: SamedayNews.com.cy Contact a health care provider if you:  Are misusing a prescription medicine and need help stopping.  Are pregnant and misusing a prescription medicine.  Need more of a medicine to get the same effects.  Have withdrawal symptoms if you try to stop taking the medicine.  Are unable to stop misusing a prescription medicine even though it is affecting your life. Summary  Prescription drug misuse happens when a medicine is used in a way that is different from how it was prescribed by the health care provider (non-medical reason).  The most common types of misused medicine are painkillers, depressants, and stimulants.  Misusing a prescription medicine can cause you to become addicted and may put your health in danger.  Contact a health care provider if you are misusing medicines and need help stopping. This information is not intended to replace advice given to you by your health care provider. Make sure you discuss any questions you have with your health care provider. Document Revised: 01/31/2019 Document Reviewed: 08/24/2017 Elsevier Patient Education  [place].   Substance Use Disorder Substance use disorder occurs when a person's repeated use of drugs or alcohol interferes with his or her ability to be productive. This disorder can cause problems with mental and physical health. It can affect your ability to have healthy relationships, and it can keep you from being able to meet your responsibilities at work, home, or school. It can also lead to addiction, which is a condition in which the person cannot stop using the substance consistently for a period of time. Addiction changes the way the brain works. Because of these changes, addiction is a chronic condition. Substance use disorder can be mild, moderate, or severe. The most  commonly abused substances include:  Alcohol.  Tobacco.  Marijuana.  Stimulants, such as cocaine and methamphetamine.  Hallucinogens, such as LSD and PCP.  Opioids, such as some prescription pain medicines and heroin. What are the causes? This condition may develop due to many complex social, psychological, or physical reasons, such as:  Stress.  Abuse.  Peer pressure.  Anxiety or depression. What increases the risk? This condition is more likely to develop in people who:  Use substances to cope with stress.  Have been abused.  Have a mental health disorder, such as depression.  Have a family history of substance use disorder. What are the signs or symptoms? Symptoms of this condition include:  Using the substance for longer periods of time or at a higher dosage than what is normal or intended.  Having a lasting desire to use the substance.  Being unable to slow down or stop the use of the substance.  Spending an abnormal amount of time getting the substance, using the substance, or recovering from using the substance.  Using the substance in a way that interferes with work, school, social activities, and personal relationships.  Using the substance even after having negative consequences, such as: ? Health problems. ? Legal or financial troubles. ? Job loss. ? Relationship problems.  Needing more and more of the substance to get the same effect (developing tolerance).  Experiencing unpleasant symptoms if you do not use the substance (withdrawal).  Using the substance to avoid withdrawal symptoms. How is this diagnosed? This condition may be diagnosed based on:  A physical exam.  Your history of substance use.  Your symptoms. This includes: ? How substance use affects your life. ? Changes in personality, behaviors, and mood. ? Having at least two symptoms of substance use disorder within a 25-month period. ? Health issues related to substance use,  such as liver damage, shortness of breath, fatigue, cough, or heart problems.  Blood or urine tests to screen for alcohol and drugs. How is this treated? This condition may be treated by:  Stopping substance use safely. This may require taking medicines and being closely monitored for several days.  Taking part in group and individual counseling from mental health providers who help people with substance use disorder.  Staying at a live-in (residential) treatment center for several days or weeks.  Attending daily counseling sessions at a treatment center.  Taking medicine as told by your health care provider: ? To ease symptoms and prevent complications during withdrawal. ? To treat other mental health issues, such as depression or anxiety. ? To block cravings by causing the same effects as the substance. ? To block the effects of the substance or replace good sensations with unpleasant ones.  Participating in a support group to share your experience with others who are going through the same thing. These groups are an important part of long-term recovery for many people. Recovery can be a long process. Many people who undergo treatment start using the substance again after stopping (relapse). If you relapse, that does not mean that treatment will not work. Follow these instructions at home:   Take over-the-counter and prescription medicines only as told by your health care provider.  Do not use any drugs or alcohol.  Avoid temptations or triggers that you associate with your use of the substance.  Learn and practice techniques for managing stress.  Have a plan for vulnerable moments. Get phone numbers of people who are willing to help and who are committed to your recovery.  Attend support groups on a regular basis. These groups include 12-step programs like Alcoholics  Anonymous and Narcotics Anonymous.  Keep all follow-up visits as told by your health care providers. This is  important. This includes continuing to work with therapists and support groups. Contact a health care provider if:  You cannot take your medicines as told.  Your symptoms get worse.  You have trouble resisting the urge to use drugs or alcohol. Get help right away if you:  Relapse.  Think that you may have taken too much of a drug. The hotline of the [facility] is (930)406-4199.  Have signs of an overdose. Symptoms include: ? Chest pain. ? Confusion. ? Sleepiness or difficulty staying awake. ? Slowed breathing. ? Nausea or vomiting. ? A seizure.  Have serious thoughts about hurting yourself or someone else. Drug overdose is an emergency. Do not wait to see if the symptoms will go away. Get medical help right away. Call your local emergency services (911 in the [place] Do not drive yourself to the hospital. If you ever feel like you may hurt yourself or others, or have thoughts about taking your own life, get help right away. You can go to your nearest emergency department or call:  Your local emergency services (911 in the [place]  A suicide crisis helpline, such as the National Suicide Prevention Lifeline at 520-229-1879. This is open 24 hours a day. Summary  Substance use disorder occurs when a person's repeated use of drugs or alcohol interferes with his or her ability to be productive.  Taking part in group and individual counseling from mental health providers is a common treatment for people with substance use disorder.  Recovery can be a long process. Many people who undergo treatment start using the substance again after stopping (relapse). A relapse does not mean that treatment will not work.  Attend support groups such as Alcoholics Anonymous and Narcotics Anonymous. These groups are an important part of long-term recovery for many people. This information is not intended to replace advice given to you by your health care provider. Make sure you  discuss any questions you have with your health care provider. Document Revised: 08/31/2018 Document Reviewed: 06/21/2017 Elsevier Patient Education  2020 [employer].

## 2019-12-14 ENCOUNTER — Ambulatory Visit (INDEPENDENT_AMBULATORY_CARE_PROVIDER_SITE_OTHER): Payer: Self-pay | Admitting: Psychiatry

## 2019-12-14 ENCOUNTER — Encounter (HOSPITAL_COMMUNITY): Payer: Self-pay | Admitting: Psychiatry

## 2019-12-14 ENCOUNTER — Other Ambulatory Visit: Payer: Self-pay

## 2019-12-14 DIAGNOSIS — F410 Panic disorder [episodic paroxysmal anxiety] without agoraphobia: Secondary | ICD-10-CM | POA: Insufficient documentation

## 2019-12-14 MED ORDER — HYDROXYZINE HCL 25 MG PO TABS: 25.0000 mg | ORAL_TABLET | Freq: Three times a day (TID) | ORAL | 2 refills | Status: DC | PRN

## 2019-12-14 MED ORDER — GABAPENTIN 100 MG PO CAPS: 100.0000 mg | ORAL_CAPSULE | Freq: Three times a day (TID) | ORAL | 2 refills | Status: DC

## 2019-12-14 MED ORDER — ALPRAZOLAM 1 MG PO TABS: 0.5000 mg | ORAL_TABLET | Freq: Two times a day (BID) | ORAL | 0 refills | Status: DC | PRN

## 2019-12-14 NOTE — Progress Notes (Signed)
Psychiatric Initial Adult Assessment   Patient Identification: Micheal [name] MRN:  [id number] Date of Evaluation:  12/14/2019 Referral Source: [place] [facility] Chief Complaint:  " The meds are working well I have been taking a drink since I started him." Visit Diagnosis:    ICD-10-CM   1. Panic disorder (episodic paroxysmal anxiety)  F41.0 hydrOXYzine (ATARAX/VISTARIL) 25 MG tablet    gabapentin (NEURONTIN) 100 MG capsule    ALPRAZolam (XANAX) 1 MG tablet    History of Present Illness: 34 year old male seen today for initial psychiatric evaluation. He was referred to outpatient psychiatry by [facility] where he was seen on 11/13/2019 for alcohol withdrawal. He has a psychiatric history of panic disorder and polysubstance use. He is currently being managed on gabapentin 100 mg three times a day and Xanax 0.5 mg twice daily. He notes that his medications are effective in managing his anxiety.  Patient notes at times he experiences increased anxiety and panic attacks. He however notes that the medication has been extremely effective in managing these symptoms during the day however notes that he would like something help him in the morning and night. He informed write that he take Xanax 1mg  in the middle of the day because he noted .05 BID was ineffective. He also informed   that since he started taking the medications he has not drank alcohol, which he notes he used in the past to help cope with anxiety. He also reports that he no longer isolates himself since starting the medication. Patient also informed writer that he no longer smokes marijuana as he notes that it makes him overly anxious.   He is agreeable to start hydroxyzine 25 mg three times a day for anxiety and he will continue all other  medications as prescribed. Potential side effects of medication and risks vs benefits of treatment vs non-treatment were explained and discussed. All questions were answered. No other  concerns noted at this time. Associated Signs/Symptoms: Depression Symptoms:  anxiety, panic attacks, weight gain, (Hypo) Manic Symptoms:  Denies Anxiety Symptoms:  Panic Symptoms, Psychotic Symptoms:  Denies PTSD Symptoms: NA  Past Psychiatric History: panic disorder and polysubstance use.  Previous Psychotropic Medications: Tried Klonipini and ativan  Substance Abuse History in the last 12 months:  Yes.    Consequences of Substance Abuse: Legal Consequences:  probation  Past Medical History:  Past Medical History:  Diagnosis Date  . Anxiety   . ETOH abuse   . Panic disorder   . Seizures (HCC)    History reviewed. No pertinent surgical history.  Family Psychiatric History: Denies  Family History: History reviewed. No pertinent family history.  Social History:   Social History   Socioeconomic History  . Marital status: Single    Spouse name: Not on file  . Number of children: Not on file  . Years of education: Not on file  . Highest education level: Not on file  Occupational History  . Not on file  Tobacco Use  . Smoking status: Current Some Day Smoker    Types: Cigarettes  . Smokeless tobacco: Never Used  Substance and Sexual Activity  . Alcohol use: Yes    Comment: intermittent binge drinks, hx of etoh withdraw with seizures  . Drug use: No  . Sexual activity: Not on file  Other Topics Concern  . Not on file  Social History Narrative  . Not on file   Social Determinants of Health   Financial Resource Strain:   . Difficulty of  Paying Living Expenses:   Food Insecurity:   . Worried About Programme researcher, broadcasting/film/video in the Last Year:   . Barista in the Last Year:   Transportation Needs:   . Freight forwarder (Medical):   Marland Kitchen Lack of Transportation (Non-Medical):   Physical Activity:   . Days of Exercise per Week:   . Minutes of Exercise per Session:   Stress:   . Feeling of Stress :   Social Connections:   . Frequency of Communication with  Friends and Family:   . Frequency of Social Gatherings with Friends and Family:   . Attends Religious Services:   . Active Member of Clubs or Organizations:   . Attends Banker Meetings:   Marland Kitchen Marital Status:     Additional Social History: Patient resides in [place] with his father and brother. He is single and has one 55 year old son (who is in the custody of his sister). He currently is unemployed. He denies alcohol and marijuina. He endorses occasional tobcco use.   Allergies:  No Known Allergies  Metabolic Disorder Labs: No results found for: HGBA1C, MPG No results found for: PROLACTIN No results found for: CHOL, TRIG, HDL, CHOLHDL, VLDL, LDLCALC Lab Results  Component Value Date   TSH 4.130 06/17/2016    Therapeutic Level Labs: No results found for: LITHIUM No results found for: CBMZ Lab Results  Component Value Date   VALPROATE 98 12/28/2011    Current Medications: Current Outpatient Medications  Medication Sig Dispense Refill  . acetaminophen (TYLENOL) 325 MG tablet Take 650 mg by mouth every 6 (six) hours as needed.    . ALPRAZolam (XANAX) 1 MG tablet Take 0.5 tablets (0.5 mg total) by mouth 2 (two) times daily as needed for anxiety. 30 tablet 0  . folic acid (FOLVITE) 1 MG tablet Take 1 tablet (1 mg total) by mouth daily. 90 tablet 0  . gabapentin (NEURONTIN) 100 MG capsule Take 1 capsule (100 mg total) by mouth 3 (three) times daily. 90 capsule 2  . hydrOXYzine (ATARAX/VISTARIL) 25 MG tablet Take 1 tablet (25 mg total) by mouth 3 (three) times daily as needed. 90 tablet 2  . thiamine 100 MG tablet Take 1 tablet (100 mg total) by mouth daily. 90 tablet 0   No current facility-administered medications for this visit.    Musculoskeletal: Strength & Muscle Tone: within normal limits Gait & Station: normal Patient leans: N/A  Psychiatric Specialty Exam: Review of Systems  There were no vitals taken for this visit.There is no height or weight on  file to calculate BMI.  General Appearance: Well Groomed  Eye Contact:  Good  Speech:  Clear and Coherent and Normal Rate  Volume:  Normal  Mood:  Anxious  Affect:  Congruent  Thought Process:  Coherent, Goal Directed and Linear  Orientation:  Full (Time, Place, and Person)  Thought Content:  WDL and Logical  Suicidal Thoughts:  No  Homicidal Thoughts:  No  Memory:  Immediate;   Good Recent;   Good Remote;   Good  Judgement:  Good  Insight:  Good  Psychomotor Activity:  Normal  Concentration:  Concentration: Good and Attention Span: Good  Recall:  Good  Fund of Knowledge:Good  Language: Good  Akathisia:  No  Handed:  Right  AIMS (if indicated):  Not done  Assets:  Communication Skills Desire for Improvement Financial Resources/Insurance Housing  ADL's:  Intact  Cognition: WNL  Sleep:  Good  Screenings:   Assessment and Plan: Patient notes that his current medication regimen are somewhat effective however notes that he still experiences breakthrough anxiety.  He is agreeable to starting hydroxyzine 25 mg three times daily to help improve symptoms of anxiety.  He will continue all other medications as prescribed. 1. Panic disorder (episodic paroxysmal anxiety)  Start- hydrOXYzine (ATARAX/VISTARIL) 25 MG tablet; Take 1 tablet (25 mg total) by mouth 3 (three) times daily as needed.  Dispense: 90 tablet; Refill: 2 COntinue- gabapentin (NEURONTIN) 100 MG capsule; Take 1 capsule (100 mg total) by mouth 3 (three) times daily.  Dispense: 90 capsule; Refill: 2 Continue- ALPRAZolam (XANAX) 1 MG tablet; Take 0.5 tablets (0.5 mg total) by mouth 2 (two) times daily as needed for anxiety.  Dispense: 30 tablet; Refill: 0   Follow-up in 1 month   Shanna Cisco, NP 7/23/20212:27 PM

## 2019-12-17 ENCOUNTER — Encounter (HOSPITAL_COMMUNITY): Payer: Self-pay | Admitting: Psychiatry

## 2020-01-08 ENCOUNTER — Other Ambulatory Visit (HOSPITAL_COMMUNITY): Payer: Self-pay | Admitting: Psychiatry

## 2020-01-08 DIAGNOSIS — F410 Panic disorder [episodic paroxysmal anxiety] without agoraphobia: Secondary | ICD-10-CM

## 2020-01-30 ENCOUNTER — Encounter (HOSPITAL_COMMUNITY): Payer: Self-pay | Admitting: Psychiatry

## 2020-01-30 ENCOUNTER — Other Ambulatory Visit: Payer: Self-pay

## 2020-01-30 ENCOUNTER — Ambulatory Visit (INDEPENDENT_AMBULATORY_CARE_PROVIDER_SITE_OTHER): Payer: Self-pay | Admitting: Psychiatry

## 2020-01-30 DIAGNOSIS — F410 Panic disorder [episodic paroxysmal anxiety] without agoraphobia: Secondary | ICD-10-CM

## 2020-01-30 MED ORDER — BUSPIRONE HCL 10 MG PO TABS: 10.0000 mg | ORAL_TABLET | Freq: Three times a day (TID) | ORAL | 2 refills | Status: DC

## 2020-01-30 MED ORDER — GABAPENTIN 100 MG PO CAPS: 100.0000 mg | ORAL_CAPSULE | Freq: Three times a day (TID) | ORAL | 2 refills | Status: DC

## 2020-01-30 MED ORDER — HYDROXYZINE HCL 25 MG PO TABS: 25.0000 mg | ORAL_TABLET | Freq: Three times a day (TID) | ORAL | 2 refills | Status: DC | PRN

## 2020-01-30 MED ORDER — ALPRAZOLAM 1 MG PO TABS: ORAL_TABLET | ORAL | 1 refills | Status: DC

## 2020-01-30 NOTE — Progress Notes (Signed)
BH MD/PA/NP OP Progress Note  01/30/2020 10:42 AM Micheal [name]  MRN:  [id number]  Chief Complaint: "I still have some anxiety" HPI:  34 year old male seen today for follow up psychiatric evaluation. He has a psychiatric history of panic disorder and polysubstance use. He is currently being managed on gabapentin 100 mg three times a day, hydroxyzine 25 mg three times, and Xanax 0.5 mg twice daily. Patient notes that he does not take his gabapentin regularly and notes that he still has some anxiety.    During assessment patient is well groomed, pleasant, and cooperative. He denies depression, SI/HI/VAH, or paranoia.He notes that he recently got some stressful new regarding his sister. He notes that she is 41 and was told that her cancer came back. Patient reports that he is hopeful that treatments will be effective. He also informed Clinical research associate that he is happy because he was approved for food stamps and is in a program to help him find a job.  Patient notes at times he experiences increased anxiety and panic attacks. He however notes that the medication are some what effective in managing his symptoms. He notes that they hydroxyzine helps a little but notes that it does not reduce all of his anxiety. He requested that his xanax be increased. Writer informed patient that at this time his xanax would not be increased. He endorsed understanding. Provider offered patient Buspar 10 mg three times daily to help manage anxiety and he agreed to try it. Potential side effects of medication and risks vs benefits of treatment vs non-treatment were explained and discussed. All questions were answered. He will continue all other medications as prescribed. No other concerns noted at this time.   .   Visit Diagnosis:    ICD-10-CM   1. Panic disorder (episodic paroxysmal anxiety)  F41.0 ALPRAZolam (XANAX) 1 MG tablet    hydrOXYzine (ATARAX/VISTARIL) 25 MG tablet    gabapentin (NEURONTIN) 100 MG capsule    busPIRone  (BUSPAR) 10 MG tablet    Past Psychiatric History: panic disorder and polysubstance use  Past Medical History:  Past Medical History:  Diagnosis Date  . Anxiety   . ETOH abuse   . Panic disorder   . Seizures (HCC)    No past surgical history on file.  Family Psychiatric History: Denies  Family History: No family history on file.  Social History:  Social History   Socioeconomic History  . Marital status: Single    Spouse name: Not on file  . Number of children: Not on file  . Years of education: Not on file  . Highest education level: Not on file  Occupational History  . Not on file  Tobacco Use  . Smoking status: Current Some Day Smoker    Types: Cigarettes  . Smokeless tobacco: Never Used  Substance and Sexual Activity  . Alcohol use: Yes    Comment: intermittent binge drinks, hx of etoh withdraw with seizures  . Drug use: No  . Sexual activity: Not on file  Other Topics Concern  . Not on file  Social History Narrative  . Not on file   Social Determinants of Health   Financial Resource Strain:   . Difficulty of Paying Living Expenses: Not on file  Food Insecurity:   . Worried About Programme researcher, broadcasting/film/video in the Last Year: Not on file  . Ran Out of Food in the Last Year: Not on file  Transportation Needs:   . Lack of Transportation (Medical): Not on  file  . Lack of Transportation (Non-Medical): Not on file  Physical Activity:   . Days of Exercise per Week: Not on file  . Minutes of Exercise per Session: Not on file  Stress:   . Feeling of Stress : Not on file  Social Connections:   . Frequency of Communication with Friends and Family: Not on file  . Frequency of Social Gatherings with Friends and Family: Not on file  . Attends Religious Services: Not on file  . Active Member of Clubs or Organizations: Not on file  . Attends Banker Meetings: Not on file  . Marital Status: Not on file    Allergies: No Known Allergies  Metabolic Disorder  Labs: No results found for: HGBA1C, MPG No results found for: PROLACTIN No results found for: CHOL, TRIG, HDL, CHOLHDL, VLDL, LDLCALC Lab Results  Component Value Date   TSH 4.130 06/17/2016   TSH 2.59 11/19/2013    Therapeutic Level Labs: No results found for: LITHIUM Lab Results  Component Value Date   VALPROATE 98 12/28/2011   No components found for:  CBMZ  Current Medications: Current Outpatient Medications  Medication Sig Dispense Refill  . acetaminophen (TYLENOL) 325 MG tablet Take 650 mg by mouth every 6 (six) hours as needed.    . ALPRAZolam (XANAX) 1 MG tablet TAKE ONE-HALF TABLET BY MOUTH TWICE DAILY AS NEEDED FOR ANXIETY 30 tablet 1  . busPIRone (BUSPAR) 10 MG tablet Take 1 tablet (10 mg total) by mouth 3 (three) times daily. 90 tablet 2  . folic acid (FOLVITE) 1 MG tablet Take 1 tablet (1 mg total) by mouth daily. 90 tablet 0  . gabapentin (NEURONTIN) 100 MG capsule Take 1 capsule (100 mg total) by mouth 3 (three) times daily. 90 capsule 2  . hydrOXYzine (ATARAX/VISTARIL) 25 MG tablet Take 1 tablet (25 mg total) by mouth 3 (three) times daily as needed. 90 tablet 2  . thiamine 100 MG tablet Take 1 tablet (100 mg total) by mouth daily. 90 tablet 0   No current facility-administered medications for this visit.     Musculoskeletal: Strength & Muscle Tone: within normal limits Gait & Station: normal Patient leans: N/A  Psychiatric Specialty Exam: Review of Systems  There were no vitals taken for this visit.There is no height or weight on file to calculate BMI.  General Appearance: Well Groomed  Eye Contact:  Good  Speech:  Clear and Coherent and Normal Rate  Volume:  Normal  Mood:  Anxious  Affect:  Congruent  Thought Process:  Coherent, Goal Directed and Linear  Orientation:  Full (Time, Place, and Person)  Thought Content: WDL and Logical   Suicidal Thoughts:  No  Homicidal Thoughts:  No  Memory:  Immediate;   Good Recent;   Good Remote;   Good   Judgement:  Good  Insight:  Good  Psychomotor Activity:  Normal  Concentration:  Concentration: Good and Attention Span: Good  Recall:  Good  Fund of Knowledge: Good  Language: Good  Akathisia:  No  Handed:  Right  AIMS (if indicated): Not done  Assets:  Communication Skills Desire for Improvement Financial Resources/Insurance Housing Social Support  ADL's:  Intact  Cognition: WNL  Sleep:  Good   Screenings:   Assessment and Plan: Patient endorses symptoms of anxiety. He is agreeable to start Buspar 10 mg three times daily to help manage symptoms. He will continue all other medications as prescribed.   Follow up in three months.  Micheal Cisco, NP 01/30/2020, 10:42 AM

## 2020-02-05 ENCOUNTER — Other Ambulatory Visit (HOSPITAL_COMMUNITY): Payer: Self-pay | Admitting: Psychiatry

## 2020-02-05 DIAGNOSIS — F410 Panic disorder [episodic paroxysmal anxiety] without agoraphobia: Secondary | ICD-10-CM

## 2020-04-27 ENCOUNTER — Other Ambulatory Visit (HOSPITAL_COMMUNITY): Payer: Self-pay | Admitting: Psychiatry

## 2020-04-27 DIAGNOSIS — F410 Panic disorder [episodic paroxysmal anxiety] without agoraphobia: Secondary | ICD-10-CM

## 2020-04-30 ENCOUNTER — Ambulatory Visit (HOSPITAL_COMMUNITY): Payer: Self-pay | Admitting: Psychiatry

## 2020-04-30 ENCOUNTER — Other Ambulatory Visit: Payer: Self-pay

## 2020-04-30 ENCOUNTER — Encounter (HOSPITAL_COMMUNITY): Payer: Self-pay | Admitting: Psychiatry

## 2020-04-30 DIAGNOSIS — F410 Panic disorder [episodic paroxysmal anxiety] without agoraphobia: Secondary | ICD-10-CM

## 2020-04-30 MED ORDER — GABAPENTIN 100 MG PO CAPS: 100.0000 mg | ORAL_CAPSULE | Freq: Three times a day (TID) | ORAL | 2 refills | Status: DC

## 2020-04-30 MED ORDER — ALPRAZOLAM 1 MG PO TABS: ORAL_TABLET | ORAL | 0 refills | Status: DC

## 2020-04-30 MED ORDER — HYDROXYZINE HCL 25 MG PO TABS: 25.0000 mg | ORAL_TABLET | Freq: Three times a day (TID) | ORAL | 2 refills | Status: DC | PRN

## 2020-04-30 MED ORDER — BUSPIRONE HCL 30 MG PO TABS: 30.0000 mg | ORAL_TABLET | Freq: Two times a day (BID) | ORAL | 2 refills | Status: DC

## 2020-04-30 NOTE — Progress Notes (Signed)
BH MD/PA/NP OP Progress Note  04/30/2020 9:35 AM Micheal [name]  MRN:  [id number]  Chief Complaint: "I think my anxiety will improve when I get a job. Can we increase my Xanax just until I get a settled?"  HPI:  34 year old male seen today for follow up psychiatric evaluation. He has a psychiatric history of panic disorder and polysubstance use. He is currently being managed on gabapentin 100 mg three times a day, hydroxyzine 25 mg three times, and Xanax 0.5 mg twice daily. Patient noted that his medications are somewhat effective in managing his psychiatric conditions.    Today he is well groomed, pleasant, engaged in conversation, maintained eye contact, and cooperative. He describes his mood as anxious. He endorsed feeling on edge, irritability, restlessness, and feeling like something awful might happen at times. He informed provider that sometimes he have panic attacks once a week and other times he has panic attacks daily. He informed provider that his father often triggers his and anxiety. He noted that his father likes to do things the hard wat and he likes to do things the easy way. Patient informed Clinical research associate that he takes 1 mg of Xanax daily to help manage his anxiety as 0.5 mg twice daily was ineffective.  He requested that his Xanax be increased until he finds a job. Provider informed patient that Xanax would not be increased today. He endorsed understanding. Patient informed provider that he has mild depression. Provider conducted a PHQ 9 and patient scored a 7. He denies SI/HI/VAH, or paranoia.    Patient informed provider that he is hopeful that he will receive a new job soon. He noted that his brother-in-law's father owns a farm and is in need of assistance. He informed provider that he is looking forward to working so that he can get his license back. He informed provider that his father informed him that he would pay for his license in January if he continues to work hard. Patient informed  provider that he lost his license 15 years ago after a DUI.  He is agreeable to increasing BuSpar 10 mg 3 times daily to 30 mg twice daily to help manage anxiety. He informed provider that he has difficulties taking medications 3 times a day. He'll continue all other medications as prescribed. Provider also recommended patient sees a therapist for counseling. He was agreeable with this recommendation and noted that he would follow-up with outpatient therapist. No other concerns noted at this time. Visit Diagnosis:    ICD-10-CM   1. Panic disorder (episodic paroxysmal anxiety)  F41.0 Ambulatory referral to Social Work    ALPRAZolam (XANAX) 1 MG tablet    busPIRone (BUSPAR) 30 MG tablet    gabapentin (NEURONTIN) 100 MG capsule    hydrOXYzine (ATARAX/VISTARIL) 25 MG tablet    Past Psychiatric History: panic disorder and polysubstance use  Past Medical History:  Past Medical History:  Diagnosis Date  . Anxiety   . ETOH abuse   . Panic disorder   . Seizures (HCC)    No past surgical history on file.  Family Psychiatric History: Denies  Family History: No family history on file.  Social History:  Social History   Socioeconomic History  . Marital status: Single    Spouse name: Not on file  . Number of children: Not on file  . Years of education: Not on file  . Highest education level: Not on file  Occupational History  . Not on file  Tobacco Use  .  Smoking status: Current Some Day Smoker    Types: Cigarettes  . Smokeless tobacco: Never Used  Substance and Sexual Activity  . Alcohol use: Yes    Comment: intermittent binge drinks, hx of etoh withdraw with seizures  . Drug use: No  . Sexual activity: Not on file  Other Topics Concern  . Not on file  Social History Narrative  . Not on file   Social Determinants of Health   Financial Resource Strain:   . Difficulty of Paying Living Expenses: Not on file  Food Insecurity:   . Worried About Programme researcher, broadcasting/film/video in the Last  Year: Not on file  . Ran Out of Food in the Last Year: Not on file  Transportation Needs:   . Lack of Transportation (Medical): Not on file  . Lack of Transportation (Non-Medical): Not on file  Physical Activity:   . Days of Exercise per Week: Not on file  . Minutes of Exercise per Session: Not on file  Stress:   . Feeling of Stress : Not on file  Social Connections:   . Frequency of Communication with Friends and Family: Not on file  . Frequency of Social Gatherings with Friends and Family: Not on file  . Attends Religious Services: Not on file  . Active Member of Clubs or Organizations: Not on file  . Attends Banker Meetings: Not on file  . Marital Status: Not on file    Allergies: No Known Allergies  Metabolic Disorder Labs: No results found for: HGBA1C, MPG No results found for: PROLACTIN No results found for: CHOL, TRIG, HDL, CHOLHDL, VLDL, LDLCALC Lab Results  Component Value Date   TSH 4.130 06/17/2016   TSH 2.59 11/19/2013    Therapeutic Level Labs: No results found for: LITHIUM Lab Results  Component Value Date   VALPROATE 98 12/28/2011   No components found for:  CBMZ  Current Medications: Current Outpatient Medications  Medication Sig Dispense Refill  . acetaminophen (TYLENOL) 325 MG tablet Take 650 mg by mouth every 6 (six) hours as needed.    . ALPRAZolam (XANAX) 1 MG tablet Take one half or one tablet once daily. 30 tablet 0  . busPIRone (BUSPAR) 30 MG tablet Take 1 tablet (30 mg total) by mouth 2 (two) times daily. 60 tablet 2  . folic acid (FOLVITE) 1 MG tablet Take 1 tablet (1 mg total) by mouth daily. 90 tablet 0  . gabapentin (NEURONTIN) 100 MG capsule Take 1 capsule (100 mg total) by mouth 3 (three) times daily. 90 capsule 2  . hydrOXYzine (ATARAX/VISTARIL) 25 MG tablet Take 1 tablet (25 mg total) by mouth 3 (three) times daily as needed. 90 tablet 2  . thiamine 100 MG tablet Take 1 tablet (100 mg total) by mouth daily. 90 tablet 0    No current facility-administered medications for this visit.     Musculoskeletal: Strength & Muscle Tone: within normal limits Gait & Station: normal Patient leans: N/A  Psychiatric Specialty Exam: Review of Systems  Blood pressure 130/72, pulse 82, height 6\' 4"  (1.93 m), weight 152 lb (68.9 kg), SpO2 100 %.Body mass index is 18.5 kg/m.  General Appearance: Well Groomed  Eye Contact:  Good  Speech:  Clear and Coherent and Normal Rate  Volume:  Normal  Mood:  Anxious  Affect:  Congruent  Thought Process:  Coherent, Goal Directed and Linear  Orientation:  Full (Time, Place, and Person)  Thought Content: WDL and Logical   Suicidal Thoughts:  No  Homicidal Thoughts:  No  Memory:  Immediate;   Good Recent;   Good Remote;   Good  Judgement:  Good  Insight:  Good  Psychomotor Activity:  Normal  Concentration:  Concentration: Good and Attention Span: Good  Recall:  Good  Fund of Knowledge: Good  Language: Good  Akathisia:  No  Handed:  Right  AIMS (if indicated): Not done  Assets:  Communication Skills Desire for Improvement Financial Resources/Insurance Housing Social Support  ADL's:  Intact  Cognition: WNL  Sleep:  Good   Screenings: GAD-7     Clinical Support from 04/30/2020 in [facility]  Total GAD-7 Score 13    PHQ2-9     Clinical Support from 04/30/2020 in [facility]  PHQ-2 Total Score 2  PHQ-9 Total Score 7       Assessment and Plan: Patient endorses symptoms of anxiety. He is agreeable to increase Buspar 10 mg three times daily to 30 mg three times daily help manage symptoms of anxiety. He will continue all other medications as prescribed.   1. Panic disorder (episodic paroxysmal anxiety)  - Ambulatory referral to Social Work Continue- ALPRAZolam Prudy Feeler) 1 MG tablet; Take one half or one tablet once daily.  Dispense: 30 tablet; Refill: 0 Increased- busPIRone (BUSPAR) 30 MG tablet; Take 1  tablet (30 mg total) by mouth 2 (two) times daily.  Dispense: 60 tablet; Refill: 2 Continue- gabapentin (NEURONTIN) 100 MG capsule; Take 1 capsule (100 mg total) by mouth 3 (three) times daily.  Dispense: 90 capsule; Refill: 2 Continue- hydrOXYzine (ATARAX/VISTARIL) 25 MG tablet; Take 1 tablet (25 mg total) by mouth 3 (three) times daily as needed.  Dispense: 90 tablet; Refill: 2  Follow up in three months.  Follow-up with therapy  Shanna Cisco, NP 04/30/2020, 9:35 AM

## 2020-06-13 ENCOUNTER — Other Ambulatory Visit (HOSPITAL_COMMUNITY): Payer: Self-pay | Admitting: Psychiatry

## 2020-06-13 DIAGNOSIS — F410 Panic disorder [episodic paroxysmal anxiety] without agoraphobia: Secondary | ICD-10-CM

## 2020-06-16 ENCOUNTER — Telehealth (HOSPITAL_COMMUNITY): Payer: Self-pay | Admitting: *Deleted

## 2020-06-16 NOTE — Telephone Encounter (Signed)
REFILL  REQUEST ALPRAZolam (XANAX) 1 MG tablet 30 tablet 0 04/30/2020    Sig: Take one half or one tablet once daily.

## 2020-06-18 ENCOUNTER — Other Ambulatory Visit (HOSPITAL_COMMUNITY): Payer: Self-pay | Admitting: Psychiatry

## 2020-06-18 DIAGNOSIS — F410 Panic disorder [episodic paroxysmal anxiety] without agoraphobia: Secondary | ICD-10-CM

## 2020-06-19 NOTE — Telephone Encounter (Signed)
Medication refilled and sent to preferred pharmacy

## 2020-06-24 ENCOUNTER — Ambulatory Visit (INDEPENDENT_AMBULATORY_CARE_PROVIDER_SITE_OTHER): Payer: Self-pay | Admitting: Clinical

## 2020-06-24 ENCOUNTER — Other Ambulatory Visit: Payer: Self-pay

## 2020-06-24 DIAGNOSIS — F410 Panic disorder [episodic paroxysmal anxiety] without agoraphobia: Secondary | ICD-10-CM

## 2020-06-24 DIAGNOSIS — F1010 Alcohol abuse, uncomplicated: Secondary | ICD-10-CM

## 2020-06-25 DIAGNOSIS — F1010 Alcohol abuse, uncomplicated: Secondary | ICD-10-CM | POA: Insufficient documentation

## 2020-06-25 NOTE — Progress Notes (Signed)
Comprehensive Clinical Assessment (CCA) Note  06/24/2020 Micheal [name] [id number]  Chief Complaint:  Chief Complaint  Patient presents with  . Anxiety   Visit Diagnosis:  Panic disorder Alcohol use disorder, mild  Interpretive Summary:  Client is a 35 year old male presenting to [facility] for outpatient behavioral health services. Client is presenting via referral by his current following psychiatrist via [facility] for a clinical assessment for therapy treatment. Client presents with a reported long-standing history of anxiety and panic attacks. Client reported he remembered feeling on edge in his teens, but it was not until his early 51's that he sought treatment and understood he had anxiety. Client endorses accelerated heart rate, shaking, shortness of breath, and fear of losing control. Client reported he has difficulty sleeping through the night and lacks appetite. Client reported his anxiety prevents him from leaving the house and/ or interacting with others. Client reported three years ago spending time in prison for a DWI. Client reported in 2021 he was detoxed at [facility] for alcohol usage. Client denied other hospitalizations for mental health concerns.  Client presented oriented times five, appropriately dressed, and cooperative. Client denied suicidal/ homicidal ideations, hallucinations and delusions. Client was screened for the following SDOH: GAD 7 : Generalized Anxiety Score 06/24/2020 04/30/2020  Nervous, Anxious, on Edge 3 3  Control/stop worrying 3 2  Worry too much - different things 3 3  Trouble relaxing 3 2  Restless 3 0  Easily annoyed or irritable 0 1  Afraid - awful might happen 1 2  Total GAD 7 Score 16 13  Anxiety Difficulty Extremely difficult Somewhat difficult    Treatment recommendations: individual therapy, psychiatric evaluation and medication management  Therapist provided information on format of appointment (virtual or face to face).  The client was  advised to call back or seek an in-person evaluation if the symptoms worsen or if the condition fails to improve as anticipated before the next scheduled appointment. Client was in agreement with treatment recommendations.   CCA Biopsychosocial Intake/Chief Complaint:  Client presents by referral of current [facility] outpatient psychiatrist for problems related to anxiety.  Current Symptoms/Problems: Client reported decreased appetite, trouble with sleep, feeling on edge, feeling nervous, isolating, rapid heart beat.   Patient Reported Schizophrenia/Schizoaffective Diagnosis in Past: No  Preferences: Client stated, "to feel better".  Type of Services Patient Feels are Needed: Individual therapy, psychiatric evaluation and medication management   Initial Clinical Notes/Concerns: No data recorded  Mental Health Symptoms Depression:  None   Duration of Depressive symptoms: No data recorded  Mania:  None   Anxiety:   Difficulty concentrating; Irritability; Restlessness; Sleep; Tension; Worrying   Psychosis:  None   Duration of Psychotic symptoms: No data recorded  Trauma:  None   Obsessions:  None   Compulsions:  None   Inattention:  None   Hyperactivity/Impulsivity:  N/A   Oppositional/Defiant Behaviors:  None   Emotional Irregularity:  None   Other Mood/Personality Symptoms:  No data recorded   Mental Status Exam Appearance and self-care  Stature:  Tall   Weight:  Average weight   Clothing:  Casual   Grooming:  Normal   Cosmetic use:  Age appropriate   Posture/gait:  Normal   Motor activity:  Not Remarkable   Sensorium  Attention:  Normal   Concentration:  Normal   Orientation:  X5   Recall/memory:  Normal   Affect and Mood  Affect:  Congruent   Mood:  Anxious   Relating  Eye contact:  Normal   Facial expression:  Responsive   Attitude toward examiner:  Cooperative   Thought and Language  Speech flow: Clear and Coherent   Thought  content:  Appropriate to Mood and Circumstances   Preoccupation:  None   Hallucinations:  None   Organization:  No data recorded  [employer] of Knowledge:  Fair   Intelligence:  Average   Abstraction:  Normal   Judgement:  Good   Reality Testing:  Adequate   Insight:  Fair   Decision Making:  Normal   Social Functioning  Social Maturity:  Isolates   Social Judgement:  Normal   Stress  Stressors:  Financial   Coping Ability:  Normal   Skill Deficits:  Self-care; Interpersonal; Activities of daily living   Supports:  Family     Religion: Religion/Spirituality Are You A Religious Person?: No  Leisure/Recreation: Leisure / Recreation Do You Have Hobbies?: Yes Leisure and Hobbies: working on car parts  Exercise/Diet: Exercise/Diet Do You Exercise?: No Have You Gained or Lost A Significant Amount of Weight in the Past Six Months?: No Do You Follow a Special Diet?: No Do You Have Any Trouble Sleeping?: Yes   CCA Employment/Education Employment/Work Situation: Employment / Work Situation Employment situation: Unemployed Has patient ever been in the [employer]?: No  Education: Education Did Garment/textile technologist From [employer]?: Yes (Has a GED.)   CCA Family/Childhood History Family and Relationship History: Family history Marital status: Single Does patient have children?: Yes How many children?: 1 How is patient's relationship with their children?: has a 68 year old son, signed custody over to his sister before he went to prison 3 years ago.  Childhood History:  Childhood History By whom was/is the patient raised?: Father Additional childhood history information: Client reported he is from [place] but moved to [place] when he was 35 years old then came back to [facility] and was raised by his dad along with his other siblings. Client reported his father did a good job raising them. Client reported he has a good relationship with his father. Client reported  he and his mother weren't close during his childhood and as he got older. Client reported his mother passed away 2020-07-05. Does patient have siblings?: Yes Number of Siblings: 10 Did patient suffer any verbal/emotional/physical/sexual abuse as a child?: No Did patient suffer from severe childhood neglect?: No Has patient ever been sexually abused/assaulted/raped as an adolescent or adult?: No Was the patient ever a victim of a crime or a disaster?: No Witnessed domestic violence?: No Has patient been affected by domestic violence as an adult?: No  Child/Adolescent Assessment:     CCA Substance Use Alcohol/Drug Use: Alcohol / Drug Use History of alcohol / drug use?: Yes Substance #1 Name of Substance 1: Alcohol -Beer 1 - Age of First Use: 14 1 - Amount (size/oz): 12 pack 1 - Frequency: weekly on weekends 1 - Last Use / Amount: one week ago                       ASAM's:  Six Dimensions of Multidimensional Assessment  Dimension 1:  Acute Intoxication and/or Withdrawal Potential:   Dimension 1:  Description of individual's past and current experiences of substance use and withdrawal: Client reported detox treatment for alcohol in June 2021.  Dimension 2:  Biomedical Conditions and Complications:   Dimension 2:  Description of patient's biomedical conditions and  complications: Client reported no medical conditions as a  result of his alcohol use.  Dimension 3:  Emotional, Behavioral, or Cognitive Conditions and Complications:  Dimension 3:  Description of emotional, behavioral, or cognitive conditions and complications: Client reported he has had problems with anxiety since he was a teenager.  Dimension 4:  Readiness to Change:  Dimension 4:  Description of Readiness to Change criteria: Client is in the precontemplation stage of change.  Dimension 5:  Relapse, Continued use, or Continued Problem Potential:  Dimension 5:  Relapse, continued use, or continued problem  potential critiera description: Client reported he continues to use with his last use being one week ago.  Dimension 6:  Recovery/Living Environment:  Dimension 6:  Recovery/Iiving environment criteria description: Client reported he has positive family support.  ASAM Severity Score: ASAM's Severity Rating Score: 5  ASAM Recommended Level of Treatment: ASAM Recommended Level of Treatment: Level I Outpatient Treatment   Substance use Disorder (SUD) Substance Use Disorder (SUD)  Checklist Symptoms of Substance Use: Presence of craving or strong urge to use,Persistent desire or unsuccessful efforts to cut down or control use  Recommendations for Services/Supports/Treatments: Recommendations for Services/Supports/Treatments Recommendations For Services/Supports/Treatments: Individual Therapy,Medication Management  DSM5 Diagnoses: Patient Active Problem List   Diagnosis Date Noted  . Alcohol use disorder, mild, abuse 06/25/2020  . Panic disorder (episodic paroxysmal anxiety) 12/14/2019  . Hyponatremia   . Hypokalemia   . Polysubstance abuse (HCC)   . Alcohol withdrawal (HCC) 11/13/2019    Patient Centered Plan: Patient is on the following Treatment Plan(s):  Anxiety   Referrals to Alternative Service(s): Referred to Alternative Service(s):   Place:   Date:   Time:    Referred to Alternative Service(s):   Place:   Date:   Time:    Referred to Alternative Service(s):   Place:   Date:   Time:    Referred to Alternative Service(s):   Place:   Date:   Time:     Loree Fee, LCSW

## 2020-07-14 ENCOUNTER — Other Ambulatory Visit (HOSPITAL_COMMUNITY): Payer: Self-pay | Admitting: Psychiatry

## 2020-07-14 DIAGNOSIS — F410 Panic disorder [episodic paroxysmal anxiety] without agoraphobia: Secondary | ICD-10-CM

## 2020-07-29 ENCOUNTER — Telehealth (INDEPENDENT_AMBULATORY_CARE_PROVIDER_SITE_OTHER): Payer: Self-pay | Admitting: Psychiatry

## 2020-07-29 ENCOUNTER — Encounter (HOSPITAL_COMMUNITY): Payer: Self-pay | Admitting: Psychiatry

## 2020-07-29 ENCOUNTER — Other Ambulatory Visit: Payer: Self-pay

## 2020-07-29 DIAGNOSIS — F410 Panic disorder [episodic paroxysmal anxiety] without agoraphobia: Secondary | ICD-10-CM

## 2020-07-29 DIAGNOSIS — F331 Major depressive disorder, recurrent, moderate: Secondary | ICD-10-CM

## 2020-07-29 MED ORDER — BUSPIRONE HCL 30 MG PO TABS: 30.0000 mg | ORAL_TABLET | Freq: Two times a day (BID) | ORAL | 2 refills | Status: DC

## 2020-07-29 MED ORDER — ALPRAZOLAM 1 MG PO TABS: ORAL_TABLET | ORAL | 0 refills | Status: DC

## 2020-07-29 MED ORDER — HYDROXYZINE HCL 25 MG PO TABS: 25.0000 mg | ORAL_TABLET | Freq: Three times a day (TID) | ORAL | 2 refills | Status: DC | PRN

## 2020-07-29 MED ORDER — GABAPENTIN 100 MG PO CAPS: 100.0000 mg | ORAL_CAPSULE | Freq: Three times a day (TID) | ORAL | 2 refills | Status: DC

## 2020-07-29 MED ORDER — MIRTAZAPINE 15 MG PO TABS: 15.0000 mg | ORAL_TABLET | Freq: Every day | ORAL | 2 refills | Status: DC

## 2020-07-29 NOTE — Progress Notes (Signed)
BH MD/PA/NP OP Progress Note Virtual Visit via Telephone Note  I connected with Micheal [name] on 07/29/20 at  4:30 PM EST by telephone and verified that I am speaking with the correct person using two identifiers.  Location: Patient: home Provider: Clinic   I discussed the limitations, risks, security and privacy concerns of performing an evaluation and management service by telephone and the availability of in person appointments. I also discussed with the patient that there may be a patient responsible charge related to this service. The patient expressed understanding and agreed to proceed.   I provided 30 minutes of non-face-to-face time during this encounter.   07/29/2020 2:07 PM Micheal [name]  MRN:  [id number]  Chief Complaint:  "I don't think the Buspar is helping. I may need an antidepressant"  HPI: 35 year old male seen today for follow up psychiatric evaluation. He has a psychiatric history of panic disorder and polysubstance use. He is currently being managed on gabapentin 100 mg three times a day, hydroxyzine 25 mg three times, Buspar 30 twice daily, and Xanax 0.5 mg twice daily. Patient noted that his medications are somewhat effective in managing his psychiatric conditions.    Today patient was unable to login so his exam was done over the phone. During exam he was pleasant, engaged in conversation, and cooperative. He notes that he has still been anxious since increasing Buspar. Provider conducted a GAD 7 and patient scored a 16, at his last visit he scored a 16. Provider also conducted a PHQ 9 and patient scored a 10, at his last visit he scored a 7.  Patient notes at times his sleep is disturbed noting that he sleeps 5 hours some days.  He also notes that his appetite can be poor at times.  Today he denies SI/HI/VAH, or paranoia.   Patient notes that he continues to look for jobs.  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  He notes that recently  they finished remodeling a home and he reports it caused him to back pain.  He takes Tylenol to help manage his back pain.   Patient informed provider that he is also trying to get his license back so that he can be more independent.  Today patient requested that Xanax be increased.  Writer informed patient that Xanax would not be increased at this time.  He then noted that maybe he should be on an antidepressant.  Provider agreed that an antidepressant would be beneficial.  Patient is agreeable to starting mirtazapine 15 mg nightly to help manage sleep, appetite, anxiety, and depression.  He notes that he would like to discontinue hydroxyzine.  He will continue all other medications   Visit Diagnosis:    ICD-10-CM   1. Panic disorder (episodic paroxysmal anxiety)  F41.0 mirtazapine (REMERON) 15 MG tablet    busPIRone (BUSPAR) 30 MG tablet    gabapentin (NEURONTIN) 100 MG capsule    hydrOXYzine (ATARAX/VISTARIL) 25 MG tablet  2. Moderate episode of recurrent major depressive disorder (HCC)  F33.1 mirtazapine (REMERON) 15 MG tablet    busPIRone (BUSPAR) 30 MG tablet    Past Psychiatric History: panic disorder and polysubstance use  Past Medical History:  Past Medical History:  Diagnosis Date  . Anxiety   . ETOH abuse   . Panic disorder   . Seizures (HCC)    No past surgical history on file.  Family Psychiatric History: Denies  Family History: No family history on file.  Social History:  Social History   Socioeconomic History  . Marital status: Single    Spouse name: Not on file  . Number of children: Not on file  . Years of education: Not on file  . Highest education level: Not on file  Occupational History  . Not on file  Tobacco Use  . Smoking status: Current Some Day Smoker    Types: Cigarettes  . Smokeless tobacco: Never Used  Substance and Sexual Activity  . Alcohol use: Yes    Comment: intermittent binge drinks, hx of etoh withdraw with seizures  . Drug use: No   . Sexual activity: Not on file  Other Topics Concern  . Not on file  Social History Narrative  . Not on file   Social Determinants of Health   Financial Resource Strain: Not on file  Food Insecurity: Not on file  Transportation Needs: Not on file  Physical Activity: Not on file  Stress: Not on file  Social Connections: Not on file    Allergies: No Known Allergies  Metabolic Disorder Labs: No results found for: HGBA1C, MPG No results found for: PROLACTIN No results found for: CHOL, TRIG, HDL, CHOLHDL, VLDL, LDLCALC Lab Results  Component Value Date   TSH 4.130 06/17/2016   TSH 2.59 11/19/2013    Therapeutic Level Labs: No results found for: LITHIUM Lab Results  Component Value Date   VALPROATE 98 12/28/2011   No components found for:  CBMZ  Current Medications: Current Outpatient Medications  Medication Sig Dispense Refill  . acetaminophen (TYLENOL) 325 MG tablet Take 650 mg by mouth every 6 (six) hours as needed.    . ALPRAZolam (XANAX) 1 MG tablet TAKE ONE-HALF TO ONE TABLET BY MOUTH ONCE DAILY 30 tablet 0  . busPIRone (BUSPAR) 30 MG tablet Take 1 tablet (30 mg total) by mouth 2 (two) times daily. 60 tablet 2  . folic acid (FOLVITE) 1 MG tablet Take 1 tablet (1 mg total) by mouth daily. 90 tablet 0  . gabapentin (NEURONTIN) 100 MG capsule Take 1 capsule (100 mg total) by mouth 3 (three) times daily. 90 capsule 2  . hydrOXYzine (ATARAX/VISTARIL) 25 MG tablet Take 1 tablet (25 mg total) by mouth 3 (three) times daily as needed. 90 tablet 2  . mirtazapine (REMERON) 15 MG tablet Take 1 tablet (15 mg total) by mouth at bedtime. 30 tablet 2  . thiamine 100 MG tablet Take 1 tablet (100 mg total) by mouth daily. 90 tablet 0   No current facility-administered medications for this visit.     Musculoskeletal: Strength & Muscle Tone: Unable to assess due to telephone visit Gait & Station: Unable to assess due to telephone visit Patient leans: N/A  Psychiatric Specialty  Exam: Review of Systems  There were no vitals taken for this visit.There is no height or weight on file to calculate BMI.  General Appearance: Unable to assess due to telephone visit  Eye Contact:  Unable to assess due to telephone visit  Speech:  Clear and Coherent and Normal Rate  Volume:  Normal  Mood:  Anxious and Depressed  Affect:  Congruent  Thought Process:  Coherent, Goal Directed and Linear  Orientation:  Full (Time, Place, and Person)  Thought Content: WDL and Logical   Suicidal Thoughts:  No  Homicidal Thoughts:  No  Memory:  Immediate;   Good Recent;   Good Remote;   Good  Judgement:  Good  Insight:  Good  Psychomotor Activity:  Normal  Concentration:  Concentration: Good and Attention Span: Good  Recall:  Good  Fund of Knowledge: Good  Language: Good  Akathisia:  No  Handed:  Right  AIMS (if indicated): Not done  Assets:  Communication Skills Desire for Improvement Financial Resources/Insurance Housing Social Support  ADL's:  Intact  Cognition: WNL  Sleep:  Fair   Screenings: GAD-7   Advertising copywriter from 06/24/2020 in [facility] Clinical Support from 04/30/2020 in [facility]  Total GAD-7 Score 16 13    PHQ2-9   Flowsheet Row Clinical Support from 04/30/2020 in [facility]  PHQ-2 Total Score 2  PHQ-9 Total Score 7       Assessment and Plan: Patient endorses symptoms of anxiety, depression, and fluctuation in sleep. Patient is agreeable to starting mirtazapine 15 mg nightly to help manage sleep, appetite, anxiety, and depression.  He notes that he would like to discontinue hydroxyzine.  1. Panic disorder (episodic paroxysmal anxiety)  Start- mirtazapine (REMERON) 15 MG tablet; Take 1 tablet (15 mg total) by mouth at bedtime.  Dispense: 30 tablet; Refill: 2 Continue- busPIRone (BUSPAR) 30 MG tablet; Take 1 tablet (30 mg total) by mouth 2 (two) times daily.   Dispense: 60 tablet; Refill: 2 Continue- gabapentin (NEURONTIN) 100 MG capsule; Take 1 capsule (100 mg total) by mouth 3 (three) times daily.  Dispense: 90 capsule; Refill: 2 Continue- hydrOXYzine (ATARAX/VISTARIL) 25 MG tablet; Take 1 tablet (25 mg total) by mouth 3 (three) times daily as needed.  Dispense: 90 tablet; Refill: 2 Continue- ALPRAZolam (XANAX) 1 MG tablet; TAKE ONE-HALF TO ONE TABLET BY MOUTH ONCE DAILY  Dispense: 30 tablet; Refill: 0  2. Moderate episode of recurrent major depressive disorder (HCC)  Continue- mirtazapine (REMERON) 15 MG tablet; Take 1 tablet (15 mg total) by mouth at bedtime.  Dispense: 30 tablet; Refill: 2 Continue- busPIRone (BUSPAR) 30 MG tablet; Take 1 tablet (30 mg total) by mouth 2 (two) times daily.  Dispense: 60 tablet; Refill: 2  Follow up in three months.  Follow-up with therapy  Shanna Cisco, NP 07/29/2020, 2:07 PM

## 2020-07-29 NOTE — Progress Notes (Signed)
BH MD/PA/NP OP Progress Note Virtual Visit via Telephone Note  I connected with Micheal [name] on 07/29/20 at  8:30 AM EST by telephone and verified that I am speaking with the correct person using two identifiers.  Location: Patient: home Provider: Clinic   I discussed the limitations, risks, security and privacy concerns of performing an evaluation and management service by telephone and the availability of in person appointments. I also discussed with the patient that there may be a patient responsible charge related to this service. The patient expressed understanding and agreed to proceed.   I provided 30 minutes of non-face-to-face time during this encounter.   07/29/2020 1:09 PM Micheal [name]  MRN:  [id number]  Chief Complaint: "I don't think the Buspar is helping. I may need an antidepressant"  HPI:  35 year old male seen today for follow up psychiatric evaluation. He has a psychiatric history of panic disorder and polysubstance use. He is currently being managed on gabapentin 100 mg three times a day, hydroxyzine 25 mg three times, Buspar 30 twice daily, and Xanax 0.5 mg twice daily. Patient noted that his medications are somewhat effective in managing his psychiatric conditions.    Today patient was unable to login so his exam was done over the phone. During exam he was pleasant, engaged in conversation, and cooperative. He notes that he has still been anxious since increasing Buspar. Provider conducted a GAD 7 and patient scored a 16, at his last visit he scored a 16. Provider also conducted a PHQ 9 and patient scored a 10, at his last visit he scored a 7.  Patient notes at times his sleep is disturbed noting that he sleeps 5 hours some days.  He also notes that his appetite can be poor at times.  Today he denies SI/HI/VAH, or paranoia.    Patient notes that he continues to look for jobs.  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  He notes that recently  they finished remodeling a home and he reports it caused him to back pain.  He takes Tylenol to help manage his back pain.   Patient informed provider that he is also trying to get his license back so that he can be more independent.  Today patient requested that Xanax be increased.  Writer informed patient that Xanax would not be increased at this time.  He then noted that maybe he should be on an antidepressant.  Provider agreed that an antidepressant would be beneficial.  Patient is agreeable to starting mirtazapine 15 mg nightly to help manage sleep, appetite, anxiety, and depression.  He notes that he would like to discontinue hydroxyzine.  He will continue all other medications as prescribed.  He will follow up with outpatient counseling for therapy.  No other concerns noted at this time   Visit Diagnosis:    ICD-10-CM   1. Moderate episode of recurrent major depressive disorder (HCC)  F33.1 busPIRone (BUSPAR) 30 MG tablet    mirtazapine (REMERON) 15 MG tablet  2. Panic disorder (episodic paroxysmal anxiety)  F41.0 gabapentin (NEURONTIN) 100 MG capsule    busPIRone (BUSPAR) 30 MG tablet    ALPRAZolam (XANAX) 1 MG tablet    mirtazapine (REMERON) 15 MG tablet    Past Psychiatric History: panic disorder and polysubstance use  Past Medical History:  Past Medical History:  Diagnosis Date  . Anxiety   . ETOH abuse   . Panic disorder   . Seizures (HCC)  History reviewed. No pertinent surgical history.  Family Psychiatric History: Denies  Family History: History reviewed. No pertinent family history.  Social History:  Social History   Socioeconomic History  . Marital status: Single    Spouse name: Not on file  . Number of children: Not on file  . Years of education: Not on file  . Highest education level: Not on file  Occupational History  . Not on file  Tobacco Use  . Smoking status: Current Some Day Smoker    Types: Cigarettes  . Smokeless tobacco: Never Used  Substance  and Sexual Activity  . Alcohol use: Yes    Comment: intermittent binge drinks, hx of etoh withdraw with seizures  . Drug use: No  . Sexual activity: Not on file  Other Topics Concern  . Not on file  Social History Narrative  . Not on file   Social Determinants of Health   Financial Resource Strain: Not on file  Food Insecurity: Not on file  Transportation Needs: Not on file  Physical Activity: Not on file  Stress: Not on file  Social Connections: Not on file    Allergies: No Known Allergies  Metabolic Disorder Labs: No results found for: HGBA1C, MPG No results found for: PROLACTIN No results found for: CHOL, TRIG, HDL, CHOLHDL, VLDL, LDLCALC Lab Results  Component Value Date   TSH 4.130 06/17/2016   TSH 2.59 11/19/2013    Therapeutic Level Labs: No results found for: LITHIUM Lab Results  Component Value Date   VALPROATE 98 12/28/2011   No components found for:  CBMZ  Current Medications: Current Outpatient Medications  Medication Sig Dispense Refill  . mirtazapine (REMERON) 15 MG tablet Take 1 tablet (15 mg total) by mouth at bedtime. 30 tablet 2  . acetaminophen (TYLENOL) 325 MG tablet Take 650 mg by mouth every 6 (six) hours as needed.    . ALPRAZolam (XANAX) 1 MG tablet TAKE ONE-HALF TO ONE TABLET BY MOUTH ONCE DAILY 30 tablet 0  . busPIRone (BUSPAR) 30 MG tablet Take 1 tablet (30 mg total) by mouth 2 (two) times daily. 60 tablet 2  . folic acid (FOLVITE) 1 MG tablet Take 1 tablet (1 mg total) by mouth daily. 90 tablet 0  . gabapentin (NEURONTIN) 100 MG capsule Take 1 capsule (100 mg total) by mouth 3 (three) times daily. 90 capsule 2  . hydrOXYzine (ATARAX/VISTARIL) 25 MG tablet Take 1 tablet (25 mg total) by mouth 3 (three) times daily as needed. 90 tablet 2  . thiamine 100 MG tablet Take 1 tablet (100 mg total) by mouth daily. 90 tablet 0   No current facility-administered medications for this visit.     Musculoskeletal: Strength & Muscle Tone: Unable to  assess due to telephone visit Gait & Station: Unable to assess due to telephone visit Patient leans: N/A  Psychiatric Specialty Exam: Review of Systems  There were no vitals taken for this visit.There is no height or weight on file to calculate BMI.  General Appearance: Unable to assess due to telephone visit  Eye Contact:  Unable to assess due to telephone visit  Speech:  Clear and Coherent and Normal Rate  Volume:  Normal  Mood:  Anxious and Depressed  Affect:  Congruent  Thought Process:  Coherent, Goal Directed and Linear  Orientation:  Full (Time, Place, and Person)  Thought Content: WDL and Logical   Suicidal Thoughts:  No  Homicidal Thoughts:  No  Memory:  Immediate;   Good Recent;  Good Remote;   Good  Judgement:  Good  Insight:  Good  Psychomotor Activity:  Normal  Concentration:  Concentration: Good and Attention Span: Good  Recall:  Good  Fund of Knowledge: Good  Language: Good  Akathisia:  No  Handed:  Right  AIMS (if indicated): Not done  Assets:  Communication Skills Desire for Improvement Financial Resources/Insurance Housing Social Support  ADL's:  Intact  Cognition: WNL  Sleep:  Fair   Screenings: GAD-7   Flowsheet Row Clinical Support from 07/30/2020 in [facility] Counselor from 06/24/2020 in [facility] Clinical Support from 04/30/2020 in [facility]  Total GAD-7 Score 16 16 13     PHQ2-9   Flowsheet Row Clinical Support from 07/30/2020 in [facility] Clinical Support from 04/30/2020 in [facility]  PHQ-2 Total Score 4 2  PHQ-9 Total Score 10 7    Flowsheet Row Clinical Support from 07/30/2020 in [facility]  C-SSRS RISK CATEGORY No Risk       Assessment and Plan: Patient endorses symptoms of anxiety, depression, and poor sleep.  Today he is agreeable to starting mirtazapine 15 mg  to help manage anxiety, depression, and sleep.  He will continue all other medications as prescribed.    1. Panic disorder (episodic paroxysmal anxiety)  Continue- gabapentin (NEURONTIN) 100 MG capsule; Take 1 capsule (100 mg total) by mouth 3 (three) times daily.  Dispense: 90 capsule; Refill: 2 Continue- busPIRone (BUSPAR) 30 MG tablet; Take 1 tablet (30 mg total) by mouth 2 (two) times daily.  Dispense: 60 tablet; Refill: 2 Continue- ALPRAZolam (XANAX) 1 MG tablet; TAKE ONE-HALF TO ONE TABLET BY MOUTH ONCE DAILY  Dispense: 30 tablet; Refill: 0 Start- mirtazapine (REMERON) 15 MG tablet; Take 1 tablet (15 mg total) by mouth at bedtime.  Dispense: 30 tablet; Refill: 2  2. Moderate episode of recurrent major depressive disorder (HCC)  Continue- busPIRone (BUSPAR) 30 MG tablet; Take 1 tablet (30 mg total) by mouth 2 (two) times daily.  Dispense: 60 tablet; Refill: 2 Start- mirtazapine (REMERON) 15 MG tablet; Take 1 tablet (15 mg total) by mouth at bedtime.  Dispense: 30 tablet; Refill: 2   Follow up in three months.  Follow-up with therapy  [facility], NP 07/29/2020, 1:09 PM

## 2020-07-30 ENCOUNTER — Ambulatory Visit (INDEPENDENT_AMBULATORY_CARE_PROVIDER_SITE_OTHER): Payer: Self-pay | Admitting: Psychiatry

## 2020-07-30 DIAGNOSIS — F331 Major depressive disorder, recurrent, moderate: Secondary | ICD-10-CM

## 2020-07-30 DIAGNOSIS — F410 Panic disorder [episodic paroxysmal anxiety] without agoraphobia: Secondary | ICD-10-CM

## 2020-08-14 ENCOUNTER — Ambulatory Visit (HOSPITAL_COMMUNITY): Payer: No Payment, Other | Admitting: Clinical

## 2020-08-26 ENCOUNTER — Other Ambulatory Visit (HOSPITAL_COMMUNITY): Payer: Self-pay | Admitting: Psychiatry

## 2020-08-26 DIAGNOSIS — F410 Panic disorder [episodic paroxysmal anxiety] without agoraphobia: Secondary | ICD-10-CM

## 2020-08-28 ENCOUNTER — Other Ambulatory Visit: Payer: Self-pay

## 2020-08-28 ENCOUNTER — Ambulatory Visit (HOSPITAL_COMMUNITY): Payer: Self-pay | Admitting: Clinical

## 2020-10-07 ENCOUNTER — Other Ambulatory Visit (HOSPITAL_COMMUNITY): Payer: Self-pay | Admitting: Psychiatry

## 2020-10-07 DIAGNOSIS — F410 Panic disorder [episodic paroxysmal anxiety] without agoraphobia: Secondary | ICD-10-CM

## 2020-10-22 ENCOUNTER — Encounter (HOSPITAL_COMMUNITY): Payer: Self-pay | Admitting: Psychiatry

## 2020-10-22 ENCOUNTER — Other Ambulatory Visit: Payer: Self-pay

## 2020-10-22 ENCOUNTER — Telehealth (INDEPENDENT_AMBULATORY_CARE_PROVIDER_SITE_OTHER): Payer: Self-pay | Admitting: Psychiatry

## 2020-10-22 DIAGNOSIS — F410 Panic disorder [episodic paroxysmal anxiety] without agoraphobia: Secondary | ICD-10-CM

## 2020-10-22 DIAGNOSIS — F331 Major depressive disorder, recurrent, moderate: Secondary | ICD-10-CM

## 2020-10-22 MED ORDER — ALPRAZOLAM 1 MG PO TABS: ORAL_TABLET | ORAL | 0 refills | Status: DC

## 2020-10-22 NOTE — Progress Notes (Signed)
BH MD/PA/NP OP Progress Note Virtual Visit via Telephone Note  I connected with Micheal [name] on 10/22/20 at  1:00 PM EDT by telephone and verified that I am speaking with the correct person using two identifiers.  Location: Patient: home Provider: Clinic   I discussed the limitations, risks, security and privacy concerns of performing an evaluation and management service by telephone and the availability of in person appointments. I also discussed with the patient that there may be a patient responsible charge related to this service. The patient expressed understanding and agreed to proceed.   I provided 30 minutes of non-face-to-face time during this encounter.   10/22/2020 12:05 PM Micheal [name]  MRN:  [id number]  Chief Complaint: "I think I need to ween myself off off some of these medications"  HPI:  35 year old male seen today for follow up psychiatric evaluation. He has a psychiatric history of panic disorder and polysubstance use. He is currently being managed on gabapentin 100 mg three times a day, hydroxyzine 25 mg three times, Buspar 30 twice daily, and Xanax 0.5 mg twice daily. Patient noted that he wants to discontinue all of his medications except Xanax.  Today patient was unable to login so his exam was done over the phone. During exam he was pleasant, engaged in conversation, and cooperative. He informed Clinical research associate that he has been doing well and notes that he wants to discontinue his medications as they are too expensive.  He notes that Xanax is $3 and his other medications are over $70.  Provider [facility] however he notes that he prefers to use his pharmacy.  Patient notes that he did not notice a difference in his anxiety or depression since starting mirtazapine.  Today provider conducted a GAD-7 and patient scored a 7, at his last visit he scored a 16.  Noted at times he worries about being 21 and  living with his father.  Provider also conducted a PHQ-9 and patient scored a 2, at his last visit he scored a 10.  Provider discussed patient's scores however he notes that he still wants to discontinue his medications.  Today he endorses fair sleep noting that he sleeps between 5 and 8 hours.  He also notes that his appetite is good.   Patient informed provider that recently he has had more energy.  He notes that he enjoys gardening and Archivist that he has been planting tomatoes, peppers, and squash.  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  Patient requested that provider increase Xanax to 1 mg twice daily.  Provider informed patient that Xanax would not be increased at this time.  He again informed provider that he wants to discontinue all of his other medications.  Provider endorsed understanding and encourage patient to cut his doses in half for a week prior to stopping.  Patient informed provider that he has enough medication to split his doses for weight as he forgot to take his medication at 1 point and has x-ray.  He endorsed understanding and agreed.  He will follow-up with outpatient counseling for therapy.  No other concerns noted at this time.     Visit Diagnosis:    ICD-10-CM   1. Panic disorder (episodic paroxysmal anxiety)  F41.0 ALPRAZolam (XANAX) 1 MG tablet  2. Moderate episode of recurrent major depressive disorder (HCC)  F33.1     Past Psychiatric History:  panic disorder and polysubstance use  Past Medical History:  Past Medical History:  Diagnosis Date  . Anxiety   . ETOH abuse   . Panic disorder   . Seizures (HCC)    History reviewed. No pertinent surgical history.  Family Psychiatric History: Denies  Family History: History reviewed. No pertinent family history.  Social History:  Social History   Socioeconomic History  . Marital status: Single    Spouse name: Not on file  . Number of children: Not on  file  . Years of education: Not on file  . Highest education level: Not on file  Occupational History  . Not on file  Tobacco Use  . Smoking status: Current Some Day Smoker    Types: Cigarettes  . Smokeless tobacco: Never Used  Substance and Sexual Activity  . Alcohol use: Yes    Comment: intermittent binge drinks, hx of etoh withdraw with seizures  . Drug use: No  . Sexual activity: Not on file  Other Topics Concern  . Not on file  Social History Narrative  . Not on file   Social Determinants of Health   Financial Resource Strain: Not on file  Food Insecurity: Not on file  Transportation Needs: Not on file  Physical Activity: Not on file  Stress: Not on file  Social Connections: Not on file    Allergies: No Known Allergies  Metabolic Disorder Labs: No results found for: HGBA1C, MPG No results found for: PROLACTIN No results found for: CHOL, TRIG, HDL, CHOLHDL, VLDL, LDLCALC Lab Results  Component Value Date   TSH 4.130 06/17/2016   TSH 2.59 11/19/2013    Therapeutic Level Labs: No results found for: LITHIUM Lab Results  Component Value Date   VALPROATE 98 12/28/2011   No components found for:  CBMZ  Current Medications: Current Outpatient Medications  Medication Sig Dispense Refill  . acetaminophen (TYLENOL) 325 MG tablet Take 650 mg by mouth every 6 (six) hours as needed.    . ALPRAZolam (XANAX) 1 MG tablet TAKE ONE-HALF TO ONE TABLET BY MOUTH ONCE DAILY 30 tablet 0  . busPIRone (BUSPAR) 30 MG tablet Take 1 tablet (30 mg total) by mouth 2 (two) times daily. 60 tablet 2  . folic acid (FOLVITE) 1 MG tablet Take 1 tablet (1 mg total) by mouth daily. 90 tablet 0  . gabapentin (NEURONTIN) 100 MG capsule Take 1 capsule (100 mg total) by mouth 3 (three) times daily. 90 capsule 2  . hydrOXYzine (ATARAX/VISTARIL) 25 MG tablet Take 1 tablet (25 mg total) by mouth 3 (three) times daily as needed. 90 tablet 2  . mirtazapine (REMERON) 15 MG tablet Take 1 tablet (15 mg  total) by mouth at bedtime. 30 tablet 2  . thiamine 100 MG tablet Take 1 tablet (100 mg total) by mouth daily. 90 tablet 0   No current facility-administered medications for this visit.     Musculoskeletal: Strength & Muscle Tone: Unable to assess due to telephone visit Gait & Station: Unable to assess due to telephone visit Patient leans: N/A  Psychiatric Specialty Exam: Review of Systems  There were no vitals taken for this visit.There is no height or weight on file to calculate BMI.  General Appearance: Unable to assess due to telephone visit  Eye Contact:  Unable to assess due to telephone visit  Speech:  Clear and Coherent and Normal Rate  Volume:  Normal  Mood:  Euthymic  Affect:  Congruent  Thought Process:  Coherent, Goal Directed and  Linear  Orientation:  Full (Time, Place, and Person)  Thought Content: WDL and Logical   Suicidal Thoughts:  No  Homicidal Thoughts:  No  Memory:  Immediate;   Good Recent;   Good Remote;   Good  Judgement:  Good  Insight:  Good  Psychomotor Activity:  Normal  Concentration:  Concentration: Good and Attention Span: Good  Recall:  Good  Fund of Knowledge: Good  Language: Good  Akathisia:  No  Handed:  Right  AIMS (if indicated): Not done  Assets:  Communication Skills Desire for Improvement Financial Resources/Insurance Housing Social Support  ADL's:  Intact  Cognition: WNL  Sleep:  Fair   Screenings: GAD-7   Flowsheet Row Video Visit from 10/22/2020 in [facility] Clinical Support from 07/30/2020 in [facility] Counselor from 06/24/2020 in [facility] Clinical Support from 04/30/2020 in [facility]  Total GAD-7 Score 7 16 16 13     PHQ2-9   Flowsheet Row Video Visit from 10/22/2020 in [facility] Clinical Support from 07/30/2020 in [facility] Clinical Support from  04/30/2020 in [facility]  PHQ-2 Total Score 0 4 2  PHQ-9 Total Score 2 10 7     Flowsheet Row Clinical Support from 07/30/2020 in [facility]  C-SSRS RISK CATEGORY No Risk       Assessment and Plan: Patient patient notes that he has not noticed a difference in his anxiety/depression since starting mirtazapine.  He notes that he wants to discontinue all of his medications except Xanax.  Provider discussed patient's improved GAD-7 and PHQ-9 scores since being on mirtazapine compared to scores prior to mirtazapine.  He endorsed understanding however notes that he still wants to discontinue all of his other medication.  Provider endorsed understanding and encourage patient to cut his dose in half for a week prior to discontinuing.  Endorsed understanding.  1. Panic disorder (episodic paroxysmal anxiety)  Continue- ALPRAZolam (XANAX) 1 MG tablet; TAKE ONE-HALF TO ONE TABLET BY MOUTH ONCE DAILY  Dispense: 30 tablet; Refill: 0    Follow up in three months.  Follow-up with therapy  09/29/2020, NP 10/22/2020, 12:05 PM

## 2020-12-01 ENCOUNTER — Other Ambulatory Visit (HOSPITAL_COMMUNITY): Payer: Self-pay | Admitting: Psychiatry

## 2020-12-01 DIAGNOSIS — F410 Panic disorder [episodic paroxysmal anxiety] without agoraphobia: Secondary | ICD-10-CM

## 2020-12-23 ENCOUNTER — Other Ambulatory Visit (HOSPITAL_COMMUNITY): Payer: Self-pay | Admitting: Psychiatry

## 2020-12-23 DIAGNOSIS — F410 Panic disorder [episodic paroxysmal anxiety] without agoraphobia: Secondary | ICD-10-CM

## 2021-01-07 ENCOUNTER — Other Ambulatory Visit (HOSPITAL_COMMUNITY): Payer: Self-pay | Admitting: Psychiatry

## 2021-01-07 DIAGNOSIS — F410 Panic disorder [episodic paroxysmal anxiety] without agoraphobia: Secondary | ICD-10-CM

## 2021-01-21 ENCOUNTER — Telehealth (INDEPENDENT_AMBULATORY_CARE_PROVIDER_SITE_OTHER): Payer: Self-pay | Admitting: Psychiatry

## 2021-01-21 ENCOUNTER — Encounter (HOSPITAL_COMMUNITY): Payer: Self-pay | Admitting: Psychiatry

## 2021-01-21 ENCOUNTER — Other Ambulatory Visit: Payer: Self-pay

## 2021-01-21 DIAGNOSIS — F410 Panic disorder [episodic paroxysmal anxiety] without agoraphobia: Secondary | ICD-10-CM

## 2021-01-21 MED ORDER — DOXEPIN HCL 25 MG PO CAPS: 25.0000 mg | ORAL_CAPSULE | Freq: Every day | ORAL | 3 refills | Status: DC

## 2021-01-21 MED ORDER — ALPRAZOLAM 1 MG PO TABS: ORAL_TABLET | ORAL | 0 refills | Status: DC

## 2021-01-21 MED ORDER — MIRTAZAPINE 15 MG PO TABS: 15.0000 mg | ORAL_TABLET | Freq: Every day | ORAL | 3 refills | Status: DC

## 2021-01-21 NOTE — Progress Notes (Signed)
BH MD/PA/NP OP Progress Note Virtual Visit via Telephone Note  I connected with Micheal [name] on 01/21/21 at 11:00 AM EDT by telephone and verified that I am speaking with the correct person using two identifiers.  Location: Patient: home Provider: Clinic   I discussed the limitations, risks, security and privacy concerns of performing an evaluation and management service by telephone and the availability of in person appointments. I also discussed with the patient that there may be a patient responsible charge related to this service. The patient expressed understanding and agreed to proceed.   I provided 30 minutes of non-face-to-face time during this encounter.   01/21/2021 11:19 AM Micheal [name]  MRN:  [id number]  Chief Complaint: "My anxiety is a little worse this week"  HPI:   35 year old male seen today for follow up psychiatric evaluation. He has a psychiatric history of panic disorder and polysubstance use. He is currently being managed on Xanax 0.5 mg twice daily. Patient noted that his medications are somewhat effective in managing his psychiatric conditions.   Today patient was well groomed, pleasant, engaged in conversation, cooperative, and engaged in eye contact. He informed Clinical research associate that his anxiety has been a little worse this week.  He notes that his neighbor is selling their home and he has been helping clear the property.  He also notes that his sleep has been disturbed.  He notes that he sleeps approximately 3-4 hours nightly.  He informed Clinical research associate that his appetite has been poor however denies weight loss.  He informed Clinical research associate that he would like to increase his weight.  Today provider conducted a GAD-7 and patient scored a 13, at his last visit he scored a 7.  Provider also conducted a PHQ-9 and patient scored a 10, at his last visit he scored a 2.  He denies SI/HI/VAH, mania, or paranoia.    Today he is agreeable to starting Doxepin 25 mg  mg to help manage anxiety,  depression, sleep, and appetite.  He will continue all other medications as prescribed and follow-up with outpatient counseling for therapy.  No other concerns noted at this time.     Visit Diagnosis:    ICD-10-CM   1. Panic disorder (episodic paroxysmal anxiety)  F41.0 ALPRAZolam (XANAX) 1 MG tablet      Past Psychiatric History: panic disorder and polysubstance use  Past Medical History:  Past Medical History:  Diagnosis Date   Anxiety    ETOH abuse    Panic disorder    Seizures (HCC)    No past surgical history on file.  Family Psychiatric History: Denies  Family History: No family history on file.  Social History:  Social History   Socioeconomic History   Marital status: Single    Spouse name: Not on file   Number of children: Not on file   Years of education: Not on file   Highest education level: Not on file  Occupational History   Not on file  Tobacco Use   Smoking status: Some Days    Types: Cigarettes   Smokeless tobacco: Never  Substance and Sexual Activity   Alcohol use: Yes    Comment: intermittent binge drinks, hx of etoh withdraw with seizures   Drug use: No   Sexual activity: Not on file  Other Topics Concern   Not on file  Social History Narrative   Not on file   Social Determinants of Health   Financial Resource Strain: Not on file  Food Insecurity: Not  on file  Transportation Needs: Not on file  Physical Activity: Not on file  Stress: Not on file  Social Connections: Not on file    Allergies: No Known Allergies  Metabolic Disorder Labs: No results found for: HGBA1C, MPG No results found for: PROLACTIN No results found for: CHOL, TRIG, HDL, CHOLHDL, VLDL, LDLCALC Lab Results  Component Value Date   TSH 4.130 06/17/2016   TSH 2.59 11/19/2013    Therapeutic Level Labs: No results found for: LITHIUM Lab Results  Component Value Date   VALPROATE 98 12/28/2011   No components found for:  CBMZ  Current Medications: Current  Outpatient Medications  Medication Sig Dispense Refill   mirtazapine (REMERON) 15 MG tablet Take 1 tablet (15 mg total) by mouth at bedtime. 30 tablet 3   acetaminophen (TYLENOL) 325 MG tablet Take 650 mg by mouth every 6 (six) hours as needed.     ALPRAZolam (XANAX) 1 MG tablet Take one tablet daily as needed 30 tablet 0   busPIRone (BUSPAR) 10 MG tablet TAKE 1 TABLET BY MOUTH THREE TIMES DAILY 90 tablet 0   folic acid (FOLVITE) 1 MG tablet Take 1 tablet (1 mg total) by mouth daily. 90 tablet 0   thiamine 100 MG tablet Take 1 tablet (100 mg total) by mouth daily. 90 tablet 0   No current facility-administered medications for this visit.     Musculoskeletal: Strength & Muscle Tone:  Unable to assess due to telehealth visit Gait & Station:  Unable to assess due to telehealth visit Patient leans: N/A  Psychiatric Specialty Exam: Review of Systems  There were no vitals taken for this visit.There is no height or weight on file to calculate BMI.  General Appearance: Well Groomed  Eye Contact:  Good  Speech:  Clear and Coherent and Normal Rate  Volume:  Normal  Mood:  Anxious  Affect:  Congruent  Thought Process:  Coherent, Goal Directed and Linear  Orientation:  Full (Time, Place, and Person)  Thought Content: WDL and Logical   Suicidal Thoughts:  No  Homicidal Thoughts:  No  Memory:  Immediate;   Good Recent;   Good Remote;   Good  Judgement:  Good  Insight:  Good  Psychomotor Activity:  Normal  Concentration:  Concentration: Good and Attention Span: Good  Recall:  Good  Fund of Knowledge: Good  Language: Good  Akathisia:  No  Handed:  Right  AIMS (if indicated): Not done  Assets:  Communication Skills Desire for Improvement Financial Resources/Insurance Housing Social Support  ADL's:  Intact  Cognition: WNL  Sleep:  Fair   Screenings: GAD-7    Flowsheet Row Video Visit from 01/21/2021 in [facility] Video Visit from 10/22/2020 in  [facility] Clinical Support from 07/30/2020 in [facility] Counselor from 06/24/2020 in [facility] Clinical Support from 04/30/2020 in [facility]  Total GAD-7 Score 13 7 16 16 13       PHQ2-9    Flowsheet Row Video Visit from 01/21/2021 in [facility] Video Visit from 10/22/2020 in [facility] Clinical Support from 07/30/2020 in [facility] Clinical Support from 04/30/2020 in [facility]  PHQ-2 Total Score 3 0 4 2  PHQ-9 Total Score 10 2 10 7       Flowsheet Row Clinical Support from 07/30/2020 in [facility]  C-SSRS RISK  CATEGORY No Risk        Assessment and Plan: Patient endorses symptoms of anxiety, depression, and insomnia. Today he is agreeable to restarting Doxepin 25 mg to help manage anxiety, depression, sleep, and appetite.  He will continue all other medications as prescribed   1. Panic disorder (episodic paroxysmal anxiety)  Continue- ALPRAZolam (XANAX) 1 MG tablet; TAKE ONE-HALF TO ONE TABLET BY MOUTH ONCE DAILY  Dispense: 30 tablet; Refill: 0 Start- doxepin (SINEQUAN) 25 MG capsule; Take 1 capsule (25 mg total) by mouth at bedtime.  Dispense: 30 capsule; Refill: 3     Follow up in three months.  Follow-up with therapy  Shanna Cisco, NP 01/21/2021, 11:19 AM

## 2021-02-12 ENCOUNTER — Other Ambulatory Visit (HOSPITAL_COMMUNITY): Payer: Self-pay | Admitting: Psychiatry

## 2021-02-12 DIAGNOSIS — F410 Panic disorder [episodic paroxysmal anxiety] without agoraphobia: Secondary | ICD-10-CM

## 2021-03-19 ENCOUNTER — Other Ambulatory Visit (HOSPITAL_COMMUNITY): Payer: Self-pay | Admitting: Psychiatry

## 2021-03-19 DIAGNOSIS — F410 Panic disorder [episodic paroxysmal anxiety] without agoraphobia: Secondary | ICD-10-CM

## 2021-03-20 ENCOUNTER — Ambulatory Visit (HOSPITAL_COMMUNITY): Payer: Self-pay | Admitting: Clinical

## 2021-04-10 ENCOUNTER — Emergency Department: Payer: Self-pay

## 2021-04-10 ENCOUNTER — Encounter: Payer: Self-pay | Admitting: Emergency Medicine

## 2021-04-10 ENCOUNTER — Other Ambulatory Visit: Payer: Self-pay

## 2021-04-10 ENCOUNTER — Inpatient Hospital Stay
Admission: EM | Admit: 2021-04-10 | Discharge: 2021-04-15 | DRG: 640 | Disposition: A | Discharge status: Home / Self Care | Payer: Self-pay | Attending: Hospitalist | Admitting: Hospitalist

## 2021-04-10 DIAGNOSIS — S72114A Nondisplaced fracture of greater trochanter of right femur, initial encounter for closed fracture: Secondary | ICD-10-CM

## 2021-04-10 DIAGNOSIS — G40909 Epilepsy, unspecified, not intractable, without status epilepticus: Secondary | ICD-10-CM | POA: Diagnosis present

## 2021-04-10 DIAGNOSIS — Z79899 Other long term (current) drug therapy: Secondary | ICD-10-CM

## 2021-04-10 DIAGNOSIS — W06XXXA Fall from bed, initial encounter: Secondary | ICD-10-CM | POA: Diagnosis present

## 2021-04-10 DIAGNOSIS — E860 Dehydration: Secondary | ICD-10-CM | POA: Diagnosis present

## 2021-04-10 DIAGNOSIS — Z20822 Contact with and (suspected) exposure to covid-19: Secondary | ICD-10-CM | POA: Diagnosis present

## 2021-04-10 DIAGNOSIS — R52 Pain, unspecified: Secondary | ICD-10-CM

## 2021-04-10 DIAGNOSIS — W19XXXA Unspecified fall, initial encounter: Secondary | ICD-10-CM

## 2021-04-10 DIAGNOSIS — F410 Panic disorder [episodic paroxysmal anxiety] without agoraphobia: Secondary | ICD-10-CM | POA: Diagnosis present

## 2021-04-10 DIAGNOSIS — Y92013 Bedroom of single-family (private) house as the place of occurrence of the external cause: Secondary | ICD-10-CM

## 2021-04-10 DIAGNOSIS — Z8249 Family history of ischemic heart disease and other diseases of the circulatory system: Secondary | ICD-10-CM

## 2021-04-10 DIAGNOSIS — E871 Hypo-osmolality and hyponatremia: Principal | ICD-10-CM

## 2021-04-10 DIAGNOSIS — F1010 Alcohol abuse, uncomplicated: Secondary | ICD-10-CM | POA: Diagnosis present

## 2021-04-10 DIAGNOSIS — E878 Other disorders of electrolyte and fluid balance, not elsewhere classified: Secondary | ICD-10-CM | POA: Diagnosis present

## 2021-04-10 DIAGNOSIS — E876 Hypokalemia: Secondary | ICD-10-CM

## 2021-04-10 DIAGNOSIS — R569 Unspecified convulsions: Secondary | ICD-10-CM

## 2021-04-10 DIAGNOSIS — Z833 Family history of diabetes mellitus: Secondary | ICD-10-CM

## 2021-04-10 DIAGNOSIS — F17210 Nicotine dependence, cigarettes, uncomplicated: Secondary | ICD-10-CM | POA: Diagnosis present

## 2021-04-10 LAB — CBC WITH DIFFERENTIAL/PLATELET
Abs Immature Granulocytes: 0.06 10*3/uL (ref 0.00–0.07)
Basophils Absolute: 0 10*3/uL (ref 0.0–0.1)
Basophils Relative: 0 %
Eosinophils Absolute: 0 10*3/uL (ref 0.0–0.5)
Eosinophils Relative: 0 %
HCT: 35.8 % — ABNORMAL LOW (ref 39.0–52.0)
Hemoglobin: 12.9 g/dL — ABNORMAL LOW (ref 13.0–17.0)
Immature Granulocytes: 1 %
Lymphocytes Relative: 14 %
Lymphs Abs: 1.5 10*3/uL (ref 0.7–4.0)
MCH: 32.7 pg (ref 26.0–34.0)
MCHC: 36 g/dL (ref 30.0–36.0)
MCV: 90.6 fL (ref 80.0–100.0)
Monocytes Absolute: 0.9 10*3/uL (ref 0.1–1.0)
Monocytes Relative: 9 %
Neutro Abs: 8.1 10*3/uL — ABNORMAL HIGH (ref 1.7–7.7)
Neutrophils Relative %: 76 %
Platelets: 196 10*3/uL (ref 150–400)
RBC: 3.95 MIL/uL — ABNORMAL LOW (ref 4.22–5.81)
RDW: 12.6 % (ref 11.5–15.5)
WBC: 10.6 10*3/uL — ABNORMAL HIGH (ref 4.0–10.5)
nRBC: 0 % (ref 0.0–0.2)

## 2021-04-10 LAB — COMPREHENSIVE METABOLIC PANEL
ALT: 14 U/L (ref 0–44)
AST: 23 U/L (ref 15–41)
Albumin: 4.2 g/dL (ref 3.5–5.0)
Alkaline Phosphatase: 64 U/L (ref 38–126)
Anion gap: 11 (ref 5–15)
BUN: 14 mg/dL (ref 6–20)
CO2: 37 mmol/L — ABNORMAL HIGH (ref 22–32)
Calcium: 9 mg/dL (ref 8.9–10.3)
Chloride: 75 mmol/L — ABNORMAL LOW (ref 98–111)
Creatinine, Ser: 0.8 mg/dL (ref 0.61–1.24)
GFR, Estimated: >60 mL/min (ref >60)
Glucose, Bld: 100 mg/dL — ABNORMAL HIGH (ref 70–99)
Potassium: <2 mmol/L — CL (ref 3.5–5.1)
Sodium: 123 mmol/L — ABNORMAL LOW (ref 135–145)
Total Bilirubin: 1.9 mg/dL — ABNORMAL HIGH (ref 0.3–1.2)
Total Protein: 8 g/dL (ref 6.5–8.1)

## 2021-04-10 LAB — ETHANOL: Alcohol, Ethyl (B): <10 mg/dL (ref <10)

## 2021-04-10 MED ORDER — ALPRAZOLAM 1 MG PO TABS
1.0000 mg | ORAL_TABLET | Freq: Every day | ORAL | Status: DC | PRN
  Administered 2021-04-11 (×3): 1 mg via ORAL
  Filled 2021-04-10: qty 1
  Filled 2021-04-10: qty 2
  Filled 2021-04-10: qty 4

## 2021-04-10 MED ORDER — MAGNESIUM HYDROXIDE 400 MG/5ML PO SUSP
30.0000 mL | Freq: Every day | ORAL | Status: DC | PRN
  Filled 2021-04-10: qty 30

## 2021-04-10 MED ORDER — BUSPIRONE HCL 10 MG PO TABS
10.0000 mg | ORAL_TABLET | Freq: Three times a day (TID) | ORAL | Status: DC
  Administered 2021-04-11 – 2021-04-15 (×12): 10 mg via ORAL
  Filled 2021-04-10 (×3): qty 1
  Filled 2021-04-10: qty 2
  Filled 2021-04-10 (×2): qty 1
  Filled 2021-04-10: qty 2
  Filled 2021-04-10 (×3): qty 1
  Filled 2021-04-10: qty 2
  Filled 2021-04-10 (×4): qty 1

## 2021-04-10 MED ORDER — FOLIC ACID 1 MG PO TABS
1.0000 mg | ORAL_TABLET | Freq: Once | ORAL | Status: AC
  Administered 2021-04-10: 1 mg via ORAL
  Filled 2021-04-10: qty 1

## 2021-04-10 MED ORDER — THIAMINE HCL 100 MG/ML IJ SOLN
100.0000 mg | Freq: Once | INTRAMUSCULAR | Status: AC
  Administered 2021-04-10: 100 mg via INTRAVENOUS
  Filled 2021-04-10: qty 2

## 2021-04-10 MED ORDER — TRAZODONE HCL 50 MG PO TABS
25.0000 mg | ORAL_TABLET | Freq: Every evening | ORAL | Status: DC | PRN
  Administered 2021-04-11 – 2021-04-14 (×4): 25 mg via ORAL
  Filled 2021-04-10 (×4): qty 1

## 2021-04-10 MED ORDER — DOXEPIN HCL 25 MG PO CAPS
25.0000 mg | ORAL_CAPSULE | Freq: Every day | ORAL | Status: DC
  Administered 2021-04-11 – 2021-04-13 (×3): 25 mg via ORAL
  Filled 2021-04-10 (×6): qty 1

## 2021-04-10 MED ORDER — ENOXAPARIN SODIUM 40 MG/0.4ML IJ SOSY
40.0000 mg | PREFILLED_SYRINGE | INTRAMUSCULAR | Status: DC
  Administered 2021-04-11 – 2021-04-15 (×5): 40 mg via SUBCUTANEOUS
  Filled 2021-04-10 (×5): qty 0.4

## 2021-04-10 MED ORDER — ONDANSETRON HCL 4 MG/2ML IJ SOLN: 4.0000 mg | Freq: Four times a day (QID) | INTRAMUSCULAR | Status: DC | PRN

## 2021-04-10 MED ORDER — ALPRAZOLAM 0.5 MG PO TABS
1.0000 mg | ORAL_TABLET | Freq: Once | ORAL | Status: AC
  Administered 2021-04-10: 1 mg via ORAL
  Filled 2021-04-10: qty 2

## 2021-04-10 MED ORDER — ACETAMINOPHEN 325 MG PO TABS
650.0000 mg | ORAL_TABLET | Freq: Four times a day (QID) | ORAL | Status: DC | PRN
  Administered 2021-04-11: 650 mg via ORAL
  Filled 2021-04-10: qty 2

## 2021-04-10 MED ORDER — SODIUM CHLORIDE 0.9 % IV SOLN: INTRAVENOUS | Status: DC

## 2021-04-10 MED ORDER — SODIUM CHLORIDE 0.9 % IV BOLUS
1000.0000 mL | Freq: Once | INTRAVENOUS | Status: AC
  Administered 2021-04-10: 1000 mL via INTRAVENOUS

## 2021-04-10 MED ORDER — POTASSIUM CHLORIDE CRYS ER 20 MEQ PO TBCR
40.0000 meq | EXTENDED_RELEASE_TABLET | Freq: Once | ORAL | Status: AC
  Administered 2021-04-10: 40 meq via ORAL
  Filled 2021-04-10: qty 2

## 2021-04-10 MED ORDER — THIAMINE HCL 100 MG PO TABS
100.0000 mg | ORAL_TABLET | Freq: Every day | ORAL | Status: DC
  Administered 2021-04-11 – 2021-04-15 (×5): 100 mg via ORAL
  Filled 2021-04-10 (×5): qty 1

## 2021-04-10 MED ORDER — ONDANSETRON HCL 4 MG PO TABS: 4.0000 mg | ORAL_TABLET | Freq: Four times a day (QID) | ORAL | Status: DC | PRN

## 2021-04-10 MED ORDER — MIRTAZAPINE 15 MG PO TABS
15.0000 mg | ORAL_TABLET | Freq: Every day | ORAL | Status: DC
  Administered 2021-04-11 – 2021-04-14 (×5): 15 mg via ORAL
  Filled 2021-04-10 (×4): qty 1

## 2021-04-10 MED ORDER — FOLIC ACID 1 MG PO TABS
1.0000 mg | ORAL_TABLET | Freq: Every day | ORAL | Status: DC
  Administered 2021-04-11 – 2021-04-15 (×5): 1 mg via ORAL
  Filled 2021-04-10 (×5): qty 1

## 2021-04-10 MED ORDER — ACETAMINOPHEN 650 MG RE SUPP: 650.0000 mg | Freq: Four times a day (QID) | RECTAL | Status: DC | PRN

## 2021-04-10 MED ORDER — POTASSIUM CHLORIDE 10 MEQ/100ML IV SOLN
10.0000 meq | Freq: Once | INTRAVENOUS | Status: AC
  Administered 2021-04-10: 10 meq via INTRAVENOUS
  Filled 2021-04-10: qty 100

## 2021-04-10 NOTE — ED Provider Notes (Signed)
Emergency Medicine Provider Triage Evaluation Note  Micheal [name] , a 35 y.o. male  was evaluated in triage.  Pt complains of R leg pain following seizure 2 days prior. States he fell backwards and hit the ground prior to having a seizure. Since then, Pt has experienced difficulty walking due to severe pain. Pt states that he currently feels pain ranging from his hip all the way down to his foot.Took tylenol/aspirin at home for it with no relief.   Review of Systems  Positive: Right hip/leg/knee/foot pain Negative: Fever/chills, SOB, chest pain, or pain in other extremities.   Physical Exam  There were no vitals taken for this visit. Gen:   Awake, no distress . No significant pain at rest.  Resp:  Normal effort  MSK:   No gross deformities. Endorses tenderness along the hip, femur, and knee. Other:  Pulse, motor, and sensation intact in lower extremities.   Medical Decision Making  Medically screening exam initiated at 4:17 PM.  Appropriate orders placed.  HILARY PUNDT was informed that the remainder of the evaluation will be completed by another provider, this initial triage assessment does not replace that evaluation, and the importance of remaining in the ED until their evaluation is complete.  Pt endorses seizure episode 2 days prior. States that he did not get evaluated for the seizure, as this is a frequent occurrence for him when he lapses on his medication. Reports only wanting to be evaluated for injuries to his right leg at today's visit. Ordered plain films for his R lower extremity.    Varney Daily, [place] 04/10/21 1628    Merwyn Katos, MD 04/10/21 [phone]

## 2021-04-10 NOTE — ED Triage Notes (Signed)
Pt comes into the ED via POV with his dad c/o right leg pain.  Pt had a seizure 2 days ago and he fell backwards.  The patient has had the pain in his leg since then and he states it is uncomfortable to ambulate at this time.  Pt has been taking tylenol at home with minimal relief.

## 2021-04-10 NOTE — ED Provider Notes (Signed)
[facility] Emergency Department Provider Note  ____________________________________________  Time seen: Approximately 5:56 PM  I have reviewed the triage vital signs and the nursing notes.   HISTORY  Chief Complaint Leg Pain    HPI Micheal [name] is a 35 y.o. male who presents emergency department for complaint of right leg pain following a seizure.  Patient states that he is on 0.5 mg twice daily of Xanax for his anxiety.  Patient states that he lost his prescription, has been out of his Xanax.  Patient states that he had a sei roughly a week after losing his Xanax, rolled out of bed onto his right side.  Patient has had hip and leg pain since this occurrence.  Patient denied any residual effects of his seizure.  States that he did hit his head.  However he is not having any headache, visual changes, neck pain.  No chest pain, shortness of breath.  Patient states that his anxiety has drastically increased without a Xanax.  He states that he had called the pharmacy and was told that his prescription would be refilled on the 28th but had not reached out to his prescriber to see if they would issue a temporary prescription.  Patient's primary complaint at this time however is right hip pain.  He still able to ambulate but doing so increases his pain   Past Medical History:  Diagnosis Date   Anxiety    ETOH abuse    Panic disorder    Seizures [facility])     Patient Active Problem List   Diagnosis Date Noted   Moderate episode of recurrent major depressive disorder (HCC) 07/29/2020   Alcohol use disorder, mild, abuse 06/25/2020   Panic disorder (episodic paroxysmal anxiety) 12/14/2019   Hyponatremia    Hypokalemia    Polysubstance abuse (HCC)    Alcohol withdrawal (HCC) 11/13/2019    History reviewed. No pertinent surgical history.  Prior to Admission medications   Medication Sig Start Date End Date Taking? Authorizing Provider  acetaminophen (TYLENOL) 325 MG  tablet Take 650 mg by mouth every 6 (six) hours as needed.   Yes [provider]  ALPRAZolam Prudy Feeler) 1 MG tablet TAKE 1 TABLET BY MOUTH ONCE DAILY AS NEEDED 03/19/21  Yes Toy Cookey E, NP  busPIRone (BUSPAR) 10 MG tablet TAKE 1 TABLET BY MOUTH THREE TIMES DAILY 01/08/21  Yes Toy Cookey E, NP  doxepin (SINEQUAN) 25 MG capsule Take 1 capsule (25 mg total) by mouth at bedtime. 01/21/21  Yes Toy Cookey E, NP  folic acid (FOLVITE) 1 MG tablet Take 1 tablet (1 mg total) by mouth daily. 11/16/19  Yes Arnetha Courser, MD  mirtazapine (REMERON) 15 MG tablet Take 15 mg by mouth at bedtime. 03/19/21  Yes [provider]  thiamine 100 MG tablet Take 1 tablet (100 mg total) by mouth daily. 11/16/19  Yes Arnetha Courser, MD    Allergies Patient has no known allergies.  History reviewed. No pertinent family history.  Social History Social History   Tobacco Use   Smoking status: Some Days    Types: Cigarettes   Smokeless tobacco: Never  Substance Use Topics   Alcohol use: Yes    Comment: intermittent binge drinks, hx of etoh withdraw with seizures   Drug use: No     Review of Systems  Constitutional: No fever/chills Eyes: No visual changes. No discharge ENT: No upper respiratory complaints. Cardiovascular: no chest pain. Respiratory: no cough. No SOB. Gastrointestinal: No abdominal pain.  No nausea, no vomiting.  No diarrhea.  No constipation. Musculoskeletal: Right hip pain Skin: Negative for rash, abrasions, lacerations, ecchymosis. Neurological: Negative for headaches, focal weakness or numbness.  10 System ROS otherwise negative.  ____________________________________________   PHYSICAL EXAM:  VITAL SIGNS: ED Triage Vitals  Enc Vitals Group     BP 04/10/21 1619 134/90     Pulse Rate 04/10/21 1619 (!) 119     Resp 04/10/21 1619 20     Temp 04/10/21 1621 98.1 F (36.7 C)     Temp Source 04/10/21 1619 Oral     SpO2 04/10/21 1619 98 %     Weight  04/10/21 1620 151 lb 14.4 oz (68.9 kg)     Height 04/10/21 1620 6\' 4"  (1.93 m)     Head Circumference --      Peak Flow --      Pain Score 04/10/21 1619 10     Pain Loc --      Pain Edu? --      Excl. in GC? --      Constitutional: Alert and oriented. Well appearing and in no acute distress. Eyes: Conjunctivae are normal. PERRL. EOMI. Head: Atraumatic. ENT:      Ears:       Nose: No congestion/rhinnorhea.      Mouth/Throat: Mucous membranes are moist.  Neck: No stridor.    Cardiovascular: Normal rate, regular rhythm. Normal S1 and S2.  Good peripheral circulation. Respiratory: Normal respiratory effort without tachypnea or retractions. Lungs CTAB. Good air entry to the bases with no decreased or absent breath sounds. Gastrointestinal: Bowel sounds 4 quadrants. Soft and nontender to palpation. No guarding or rigidity. No palpable masses. No distention. No CVA tenderness. Musculoskeletal: Full range of motion to all extremities. No gross deformities appreciated.  Palpation along the greater trochanter region does elicit tenderness.  Patient is able to extend, flex the hip, internally and externally rotate hip though this does increase patient's pain.  Palpation over the patient's distal femur, lower right leg is nontender on palpation.  Dorsalis pedis pulse intact distally.  Palpation over the lumbar spine is unremarkable as well. Neurologic:  Normal speech and language. No gross focal neurologic deficits are appreciated.  Skin:  Skin is warm, dry and intact. No rash noted. Psychiatric: Mood and affect are normal. Speech and behavior are normal. Patient exhibits appropriate insight and judgement.   ____________________________________________   LABS (all labs ordered are listed, but only abnormal results are displayed)  Labs Reviewed  COMPREHENSIVE METABOLIC PANEL - Abnormal; Notable for the following components:      Result Value   Sodium 123 (*)    Potassium <2.0 (*)     Chloride 75 (*)    CO2 37 (*)    Glucose, Bld 100 (*)    Total Bilirubin 1.9 (*)    All other components within normal limits  CBC WITH DIFFERENTIAL/PLATELET - Abnormal; Notable for the following components:   WBC 10.6 (*)    RBC 3.95 (*)    Hemoglobin 12.9 (*)    HCT 35.8 (*)    Neutro Abs 8.1 (*)    All other components within normal limits  ETHANOL  URINALYSIS, ROUTINE W REFLEX MICROSCOPIC  URINE DRUG SCREEN, QUALITATIVE ([facility] ONLY)  MAGNESIUM  VITAMIN B1   ____________________________________________  EKG   ____________________________________________  RADIOLOGY I personally viewed and evaluated these images as part of my medical decision making, as well as reviewing the written report by the radiologist.  ED  Provider Interpretation: Imaging of the lumbar spine, tib-fib and foot is unremarkable.  Patient has a nondisplaced fracture of the greater trochanter that does not involve the lesser trochanter or surgical neck.  DG Lumbar Spine 2-3 Views  Result Date: 04/10/2021 CLINICAL DATA:  Status post fall. EXAM: LUMBAR SPINE - 2-3 VIEW COMPARISON:  None. FINDINGS: There is no evidence of lumbar spine fracture. Alignment is normal. Intervertebral disc spaces are maintained. IMPRESSION: Negative. Electronically Signed   By: Aram Candela M.D.   On: 04/10/2021 17:18   DG Tibia/Fibula Right  Result Date: 04/10/2021 CLINICAL DATA:  Status post fall. EXAM: RIGHT TIBIA AND FIBULA - 2 VIEW COMPARISON:  None. FINDINGS: There is no evidence of fracture or other focal bone lesions. Soft tissues are unremarkable. IMPRESSION: Negative. Electronically Signed   By: Aram Candela M.D.   On: 04/10/2021 17:15   CT HEAD WO CONTRAST ( )  Result Date: 04/10/2021 CLINICAL DATA:  Pt comes into the ED via POV with his dad c/o right leg pain. Pt had a seizure 2 days ago and he fell backwards. EXAM: CT HEAD WITHOUT CONTRAST CT CERVICAL SPINE WITHOUT CONTRAST TECHNIQUE: Multidetector CT  imaging of the head and cervical spine was performed following the standard protocol without intravenous contrast. Multiplanar CT image reconstructions of the cervical spine were also generated. COMPARISON:  CT head 04/29/2005 report without imaging FINDINGS: CT HEAD FINDINGS Brain: No evidence of large-territorial acute infarction. No parenchymal hemorrhage. No mass lesion. No extra-axial collection. No mass effect or midline shift. No hydrocephalus. Basilar cisterns are patent. Vascular: No hyperdense vessel. Skull: No acute fracture or focal lesion. Sinuses/Orbits: Paranasal sinuses and mastoid air cells are clear. The orbits are unremarkable. Other: None. CT CERVICAL SPINE FINDINGS Alignment: Straightening of the cervical lordosis. Skull base and vertebrae: No acute fracture. No aggressive appearing focal osseous lesion or focal pathologic process. Soft tissues and spinal canal: No prevertebral fluid or swelling. No visible canal hematoma. Upper chest: Severe biapical pleural/pulmonary scarring. Other: None. IMPRESSION: 1. No acute intracranial abnormality. 2. No acute displaced fracture or traumatic listhesis of the cervical spine. Electronically Signed   By: Tish Frederickson M.D.   On: 04/10/2021 18:33   CT Cervical Spine Wo Contrast  Result Date: 04/10/2021 CLINICAL DATA:  Pt comes into the ED via POV with his dad c/o right leg pain. Pt had a seizure 2 days ago and he fell backwards. EXAM: CT HEAD WITHOUT CONTRAST CT CERVICAL SPINE WITHOUT CONTRAST TECHNIQUE: Multidetector CT imaging of the head and cervical spine was performed following the standard protocol without intravenous contrast. Multiplanar CT image reconstructions of the cervical spine were also generated. COMPARISON:  CT head 04/29/2005 report without imaging FINDINGS: CT HEAD FINDINGS Brain: No evidence of large-territorial acute infarction. No parenchymal hemorrhage. No mass lesion. No extra-axial collection. No mass effect or midline shift.  No hydrocephalus. Basilar cisterns are patent. Vascular: No hyperdense vessel. Skull: No acute fracture or focal lesion. Sinuses/Orbits: Paranasal sinuses and mastoid air cells are clear. The orbits are unremarkable. Other: None. CT CERVICAL SPINE FINDINGS Alignment: Straightening of the cervical lordosis. Skull base and vertebrae: No acute fracture. No aggressive appearing focal osseous lesion or focal pathologic process. Soft tissues and spinal canal: No prevertebral fluid or swelling. No visible canal hematoma. Upper chest: Severe biapical pleural/pulmonary scarring. Other: None. IMPRESSION: 1. No acute intracranial abnormality. 2. No acute displaced fracture or traumatic listhesis of the cervical spine. Electronically Signed   By: Tish Frederickson M.D.   On:  04/10/2021 18:33   DG Foot Complete Right  Result Date: 04/10/2021 CLINICAL DATA:  Status post fall. EXAM: RIGHT FOOT COMPLETE - 3+ VIEW COMPARISON:  None. FINDINGS: There is no evidence of fracture or dislocation. There is no evidence of arthropathy or other focal bone abnormality. Soft tissues are unremarkable. IMPRESSION: Negative. Electronically Signed   By: Aram Candela M.D.   On: 04/10/2021 17:16   DG FEMUR, MIN 2 VIEWS RIGHT  Result Date: 04/10/2021 CLINICAL DATA:  Status post fall. EXAM: RIGHT FEMUR 2 VIEWS COMPARISON:  None. FINDINGS: An acute, nondisplaced fracture is seen involving the greater trochanter of the proximal right femur. There is no evidence of dislocation. Soft tissues are unremarkable. IMPRESSION: Acute nondisplaced fracture of the greater trochanter of the proximal right femur. Electronically Signed   By: Aram Candela M.D.   On: 04/10/2021 17:17    ____________________________________________    PROCEDURES  Procedure(s) performed:    Procedures    Medications  ALPRAZolam Prudy Feeler) tablet 1 mg (1 mg Oral Given 04/10/21 1824)  potassium chloride 10 mEq in 100 mL IVPB (0 mEq Intravenous Stopped  04/10/21 2252)  potassium chloride SA (KLOR-CON) CR tablet 40 mEq (40 mEq Oral Given 04/10/21 2139)  sodium chloride 0.9 % bolus 1,000 mL (1,000 mLs Intravenous New Bag/Given 04/10/21 2151)  thiamine (B-1) injection 100 mg (100 mg Intravenous Given 04/10/21 2251)  folic acid (FOLVITE) tablet 1 mg (1 mg Oral Given 04/10/21 2250)     ____________________________________________   INITIAL IMPRESSION / ASSESSMENT AND PLAN / ED COURSE  Pertinent labs & imaging results that were available during my care of the patient were reviewed by me and considered in my medical decision making (see chart for details).  Review of the [place] CSRS was performed in accordance of the [facility] prior to dispensing any controlled drugs.  Clinical Course as of 04/10/21 2301  Fri Apr 10, 2021  1722 Patient with a greater trochanter fracture.  Unsure whether this was from seizure or fall after his seizure.  I discussed the patient with orthopedic surgeon, Dr. Hyacinth Meeker.  Recommendations at this time are to be nonweightbearing with the use of crutches for ambulation.  Patient will be followed by orthopedics in 7 to 10 days for repeat imaging.  At this time pursuing labs, further imaging of head and neck to ensure no other acute injury or abnormality from seizure-like activity. [JC]    Clinical Course User Index [JC] Sylwia Cuervo, Delorise Royals, PA-C          Patient's diagnosis is consistent with greater trochanter fracture, hyponatremia, hypokalemia.  Patient reportedly had a seizure 2 days ago.  He does have a history of seizures, states that when he runs out of his anxiety medication he will have seizures.  Patient is on daily Xanax for his anxiety.  He takes half a milligram morning and night.  Patient was neurologically intact.  His primary complaint was hip pain.  Imaging revealed that he had a greater trochanter fracture with no evidence of extension into the lesser trochanter, surgical neck.  I talked to orthopedics who  recommended nonweightbearing for this injury.  I pursued labs, imaging of his head and neck given the reported fall and striking his head as well as a seizure-like activity.  Imaging of the head and neck is reassuring with no acute traumatic findings.  Patient's labs returned with a sodium of 123 and potassium of 2.  He is receiving IV fluids and potassium supplementation at this time.  Given the hypokalemia, hyponatremia, fracture I feel that the patient will require admission.  We will reach out to the hospitalist team at this time..     ____________________________________________  FINAL CLINICAL IMPRESSION(S) / ED DIAGNOSES  Final diagnoses:  Seizure (HCC)  Hypokalemia  Hyponatremia  Closed nondisplaced fracture of greater trochanter of right femur, initial encounter (HCC)      NEW MEDICATIONS STARTED DURING THIS VISIT:  ED Discharge Orders     None           This chart was dictated using voice recognition software/Dragon. Despite best efforts to proofread, errors can occur which can change the meaning. Any change was purely unintentional.    Racheal Patches, PA-C 04/10/21 2301    Minna Antis, MD 04/12/21 [phone]

## 2021-04-10 NOTE — H&P (Addendum)
[place]   PATIENT NAME: Micheal [name]    MR#:  [id number]  DATE OF BIRTH:  Mar 02, 1986  DATE OF ADMISSION:  04/10/2021  PRIMARY CARE PHYSICIAN: Pcp, No   Patient is coming from: Home  REQUESTING/REFERRING PHYSICIAN: Cuthriell, Leola Brazil  CHIEF COMPLAINT:   Chief Complaint  Patient presents with   Leg Pain    HISTORY OF PRESENT ILLNESS:  Micheal [name] is a 35 y.o. Caucasian male with medical history significant for anxiety and panic disorder, alcohol abuse and seizure disorder, who presented to the emergency room with acute onset of right hip pain after having a fall from bed a couple of days ago when he had a seizure episode.  He stated that he has stopped his Xanax that he takes for anxiety recently.  He has not had much p.o. intake for the last couple of days since his fall.  He denied any nausea currently but had nausea and vomiting after drinking beer on Monday.  He stated that he drinks about 2 days a week 1-2 beer or liquor and every 1 to 2 months may go on a 2 to 3-day drinking binge.  No fever or chills.  No chest pain or dyspnea or cough or wheezing.  No dysuria, oliguria or hematuria, urgency or frequency or flank pain.  He has been having worsening pain with his right hip since his fall.  ED Course: Upon presentation to the emergency room, heart rate was 119 with otherwise normal vital signs.  Labs revealed hyponatremia 123 with hypokalemia of less than 2 and hypochloremia of 75.  CO2 is 37 and blood glucose was 100.  Total bili was 1.9 with otherwise unremarkable CMP.  CBC showed WBC of 10.6 with hemoglobin 12.9 hematocrit 35.8 close to baseline.  Imaging: Noncontrasted head CT scan revealed no acute intracranial normalities.  C-spine CT showed no acute displaced fracture or traumatic listhesis of the C-spine.  He had a 2 view right femur x-ray revealed acute nondisplaced fracture of the greater trochanter of the proximal right femur without extension to the  neck or the lesser trochanter.  Right foot x-ray was negative LS-spine x-ray was negative right tib/fib x-ray was normal.  The patient was given 1 mg of p.o. Xanax, 1 mg of p.o. folic acid, 10 mill equivalent IV potassium chloride and 40 mEq p.o. potassium chloride, 1 L bolus of IV normal saline and 100 mg of IV thiamine.  The patient will be admitted to a medical telemetry bed for further evaluation and management. PAST MEDICAL HISTORY:   Past Medical History:  Diagnosis Date   Anxiety    ETOH abuse    Panic disorder    Seizures ([facility])     PAST SURGICAL HISTORY:  History reviewed. No pertinent surgical history.  He denies any previous surgeries.  SOCIAL HISTORY:   Social History   Tobacco Use   Smoking status: Some Days    Types: Cigarettes   Smokeless tobacco: Never  Substance Use Topics   Alcohol use: Yes    Comment: intermittent binge drinks, hx of etoh withdraw with seizures    FAMILY HISTORY:  Positive for MI, diabetes mellitus and hypertension in his father.  DRUG ALLERGIES:  No Known Allergies  REVIEW OF SYSTEMS:   ROS As per history of present illness. All pertinent systems were reviewed above. Constitutional, HEENT, cardiovascular, respiratory, GI, GU, musculoskeletal, neuro, psychiatric, endocrine, integumentary and hematologic systems were reviewed and are otherwise negative/unremarkable except for  positive findings mentioned above in the HPI.   MEDICATIONS AT HOME:   Prior to Admission medications   Medication Sig Start Date End Date Taking? Authorizing Provider  acetaminophen (TYLENOL) 325 MG tablet Take 650 mg by mouth every 6 (six) hours as needed.   Yes [provider]  ALPRAZolam Prudy Feeler) 1 MG tablet TAKE 1 TABLET BY MOUTH ONCE DAILY AS NEEDED 03/19/21  Yes Toy Cookey E, NP  busPIRone (BUSPAR) 10 MG tablet TAKE 1 TABLET BY MOUTH THREE TIMES DAILY 01/08/21  Yes Toy Cookey E, NP  doxepin (SINEQUAN) 25 MG capsule Take 1 capsule (25 mg  total) by mouth at bedtime. 01/21/21  Yes Toy Cookey E, NP  folic acid (FOLVITE) 1 MG tablet Take 1 tablet (1 mg total) by mouth daily. 11/16/19  Yes Arnetha Courser, MD  mirtazapine (REMERON) 15 MG tablet Take 15 mg by mouth at bedtime. 03/19/21  Yes [provider]  thiamine 100 MG tablet Take 1 tablet (100 mg total) by mouth daily. 11/16/19  Yes Arnetha Courser, MD      VITAL SIGNS:  Blood pressure 134/90, pulse (!) 119, temperature 98.1 F (36.7 C), temperature source Oral, resp. rate 20, height 6\' 4"  (1.93 m), weight 68.9 kg, SpO2 98 %.  PHYSICAL EXAMINATION:  Physical Exam  GENERAL:  35 y.o.-year-old Caucasian male patient lying in the bed with no acute distress.  EYES: Pupils equal, round, reactive to light and accommodation. No scleral icterus. Extraocular muscles intact.  HEENT: Head atraumatic, normocephalic. Oropharynx with slightly dry mucous membranes and tongue, nasopharynx clear.  NECK:  Supple, no jugular venous distention. No thyroid enlargement, no tenderness.  LUNGS: Normal breath sounds bilaterally, no wheezing, rales,rhonchi or crepitation. No use of accessory muscles of respiration.  CARDIOVASCULAR: Regular rate and rhythm, S1, S2 normal. No murmurs, rubs, or gallops.  ABDOMEN: Soft, nondistended, nontender. Bowel sounds present. No organomegaly or mass.  EXTREMITIES: No pedal edema, cyanosis, or clubbing. Musculoskeletal: Right lateral hip tenderness. NEUROLOGIC: Cranial nerves II through XII are intact. Muscle strength 5/5 in all extremities. Sensation intact. Gait not checked.  PSYCHIATRIC: The patient is alert and oriented x 3.  Normal affect and good eye contact. SKIN: No obvious rash, lesion, or ulcer.   LABORATORY PANEL:   CBC Recent Labs  Lab 04/10/21 1726  WBC 10.6*  HGB 12.9*  HCT 35.8*  PLT 196   ------------------------------------------------------------------------------------------------------------------  Chemistries  Recent Labs   Lab 04/10/21 1726  NA 123*  K <2.0*  CL 75*  CO2 37*  GLUCOSE 100*  BUN 14  CREATININE 0.80  CALCIUM 9.0  AST 23  ALT 14  ALKPHOS 64  BILITOT 1.9*   ------------------------------------------------------------------------------------------------------------------  Cardiac Enzymes No results for input(s): TROPONINI in the last 168 hours. ------------------------------------------------------------------------------------------------------------------  RADIOLOGY:  DG Lumbar Spine 2-3 Views  Result Date: 04/10/2021 CLINICAL DATA:  Status post fall. EXAM: LUMBAR SPINE - 2-3 VIEW COMPARISON:  None. FINDINGS: There is no evidence of lumbar spine fracture. Alignment is normal. Intervertebral disc spaces are maintained. IMPRESSION: Negative. Electronically Signed   By: 04/12/2021 M.D.   On: 04/10/2021 17:18   DG Tibia/Fibula Right  Result Date: 04/10/2021 CLINICAL DATA:  Status post fall. EXAM: RIGHT TIBIA AND FIBULA - 2 VIEW COMPARISON:  None. FINDINGS: There is no evidence of fracture or other focal bone lesions. Soft tissues are unremarkable. IMPRESSION: Negative. Electronically Signed   By: 04/12/2021 M.D.   On: 04/10/2021 17:15   CT HEAD WO CONTRAST (04/12/2021)  Result  Date: 04/10/2021 CLINICAL DATA:  Pt comes into the ED via POV with his dad c/o right leg pain. Pt had a seizure 2 days ago and he fell backwards. EXAM: CT HEAD WITHOUT CONTRAST CT CERVICAL SPINE WITHOUT CONTRAST TECHNIQUE: Multidetector CT imaging of the head and cervical spine was performed following the standard protocol without intravenous contrast. Multiplanar CT image reconstructions of the cervical spine were also generated. COMPARISON:  CT head 04/29/2005 report without imaging FINDINGS: CT HEAD FINDINGS Brain: No evidence of large-territorial acute infarction. No parenchymal hemorrhage. No mass lesion. No extra-axial collection. No mass effect or midline shift. No hydrocephalus. Basilar cisterns are  patent. Vascular: No hyperdense vessel. Skull: No acute fracture or focal lesion. Sinuses/Orbits: Paranasal sinuses and mastoid air cells are clear. The orbits are unremarkable. Other: None. CT CERVICAL SPINE FINDINGS Alignment: Straightening of the cervical lordosis. Skull base and vertebrae: No acute fracture. No aggressive appearing focal osseous lesion or focal pathologic process. Soft tissues and spinal canal: No prevertebral fluid or swelling. No visible canal hematoma. Upper chest: Severe biapical pleural/pulmonary scarring. Other: None. IMPRESSION: 1. No acute intracranial abnormality. 2. No acute displaced fracture or traumatic listhesis of the cervical spine. Electronically Signed   By: Iven Finn M.D.   On: 04/10/2021 18:33   CT Cervical Spine Wo Contrast  Result Date: 04/10/2021 CLINICAL DATA:  Pt comes into the ED via POV with his dad c/o right leg pain. Pt had a seizure 2 days ago and he fell backwards. EXAM: CT HEAD WITHOUT CONTRAST CT CERVICAL SPINE WITHOUT CONTRAST TECHNIQUE: Multidetector CT imaging of the head and cervical spine was performed following the standard protocol without intravenous contrast. Multiplanar CT image reconstructions of the cervical spine were also generated. COMPARISON:  CT head 04/29/2005 report without imaging FINDINGS: CT HEAD FINDINGS Brain: No evidence of large-territorial acute infarction. No parenchymal hemorrhage. No mass lesion. No extra-axial collection. No mass effect or midline shift. No hydrocephalus. Basilar cisterns are patent. Vascular: No hyperdense vessel. Skull: No acute fracture or focal lesion. Sinuses/Orbits: Paranasal sinuses and mastoid air cells are clear. The orbits are unremarkable. Other: None. CT CERVICAL SPINE FINDINGS Alignment: Straightening of the cervical lordosis. Skull base and vertebrae: No acute fracture. No aggressive appearing focal osseous lesion or focal pathologic process. Soft tissues and spinal canal: No prevertebral  fluid or swelling. No visible canal hematoma. Upper chest: Severe biapical pleural/pulmonary scarring. Other: None. IMPRESSION: 1. No acute intracranial abnormality. 2. No acute displaced fracture or traumatic listhesis of the cervical spine. Electronically Signed   By: Iven Finn M.D.   On: 04/10/2021 18:33   DG Foot Complete Right  Result Date: 04/10/2021 CLINICAL DATA:  Status post fall. EXAM: RIGHT FOOT COMPLETE - 3+ VIEW COMPARISON:  None. FINDINGS: There is no evidence of fracture or dislocation. There is no evidence of arthropathy or other focal bone abnormality. Soft tissues are unremarkable. IMPRESSION: Negative. Electronically Signed   By: Virgina Norfolk M.D.   On: 04/10/2021 17:16   DG FEMUR, MIN 2 VIEWS RIGHT  Result Date: 04/10/2021 CLINICAL DATA:  Status post fall. EXAM: RIGHT FEMUR 2 VIEWS COMPARISON:  None. FINDINGS: An acute, nondisplaced fracture is seen involving the greater trochanter of the proximal right femur. There is no evidence of dislocation. Soft tissues are unremarkable. IMPRESSION: Acute nondisplaced fracture of the greater trochanter of the proximal right femur. Electronically Signed   By: Virgina Norfolk M.D.   On: 04/10/2021 17:17      IMPRESSION AND PLAN:  Principal  Problem:   Hyponatremia  1.  Hyponatremia and hypochloremia. - The patient will be admitted to a medical telemetry monitored bed. - We will obtain hyponatremia work-up. - He will be hydrated with IV normal saline. - We will limit fluids to 1500 mL/day. - We will follow sodium level.  2.  Hypokalemia. - Potassium replacement will be pursued and potassium level will be checked. - Magnesium level will be checked.  3.  Right greater trochanter fracture. - Orthopedic consultation will be obtained. - I notified Dr. Sabra Heck about the patient. - This is not likely going to require surgical intervention. - He will be nonweightbearing using crutches. - Pain management will be  provided  4.  Seizure disorder. - She will be placed on as needed IV Ativan.  5.  Anxiety and panic disorder. - We will continue Xanax and BuSpar.  DVT prophylaxis: Lovenox. Code Status: full code. Family Communication:  The plan of care was discussed in details with the patient (and family). I answered all questions. The patient agreed to proceed with the above mentioned plan. Further management will depend upon hospital course. Disposition Plan: Back to previous home environment Consults called: none. All the records are reviewed and case discussed with ED provider.  Status is: Inpatient  Remains inpatient appropriate because:Ongoing diagnostic testing needed not appropriate for outpatient work up, Unsafe d/c plan, IV treatments appropriate due to intensity of illness or inability to take PO, and Inpatient level of care appropriate due to severity of illness   Dispo: The patient is from: Home              Anticipated d/c is to: Home              Patient currently is not medically stable to d/c.              Difficult to place patient: No  TOTAL TIME TAKING CARE OF THIS PATIENT: 55 minutes.     Christel Mormon M.D on 04/10/2021 at 10:15 PM  [facility]   From 7 PM-7 AM, contact night-coverage [email or web address]  CC: Primary care physician; Pcp, No

## 2021-04-11 DIAGNOSIS — W19XXXA Unspecified fall, initial encounter: Secondary | ICD-10-CM

## 2021-04-11 DIAGNOSIS — R569 Unspecified convulsions: Secondary | ICD-10-CM

## 2021-04-11 LAB — BASIC METABOLIC PANEL
Anion gap: 8 (ref 5–15)
Anion gap: 9 (ref 5–15)
BUN: 9 mg/dL (ref 6–20)
BUN: 6 mg/dL (ref 6–20)
CO2: 35 mmol/L — ABNORMAL HIGH (ref 22–32)
CO2: 31 mmol/L (ref 22–32)
Calcium: 8.5 mg/dL — ABNORMAL LOW (ref 8.9–10.3)
Calcium: 8.3 mg/dL — ABNORMAL LOW (ref 8.9–10.3)
Chloride: 84 mmol/L — ABNORMAL LOW (ref 98–111)
Chloride: 89 mmol/L — ABNORMAL LOW (ref 98–111)
Creatinine, Ser: 0.57 mg/dL — ABNORMAL LOW (ref 0.61–1.24)
Creatinine, Ser: 0.61 mg/dL (ref 0.61–1.24)
GFR, Estimated: >60 mL/min (ref >60)
GFR, Estimated: >60 mL/min (ref >60)
Glucose, Bld: 98 mg/dL (ref 70–99)
Glucose, Bld: 87 mg/dL (ref 70–99)
Potassium: 2 mmol/L — CL (ref 3.5–5.1)
Potassium: 4.1 mmol/L (ref 3.5–5.1)
Sodium: 127 mmol/L — ABNORMAL LOW (ref 135–145)
Sodium: 129 mmol/L — ABNORMAL LOW (ref 135–145)

## 2021-04-11 LAB — RESP PANEL BY RT-PCR (FLU A&B, COVID) ARPGX2
Influenza A by PCR: NEGATIVE
Influenza B by PCR: NEGATIVE
SARS Coronavirus 2 by RT PCR: NEGATIVE

## 2021-04-11 LAB — CBC
HCT: 33.9 % — ABNORMAL LOW (ref 39.0–52.0)
Hemoglobin: 11.9 g/dL — ABNORMAL LOW (ref 13.0–17.0)
MCH: 32.3 pg (ref 26.0–34.0)
MCHC: 35.1 g/dL (ref 30.0–36.0)
MCV: 92.1 fL (ref 80.0–100.0)
Platelets: 166 10*3/uL (ref 150–400)
RBC: 3.68 MIL/uL — ABNORMAL LOW (ref 4.22–5.81)
RDW: 12.9 % (ref 11.5–15.5)
WBC: 7.6 10*3/uL (ref 4.0–10.5)
nRBC: 0 % (ref 0.0–0.2)

## 2021-04-11 LAB — HIV ANTIBODY (ROUTINE TESTING W REFLEX): HIV Screen 4th Generation wRfx: NONREACTIVE

## 2021-04-11 LAB — URINALYSIS, ROUTINE W REFLEX MICROSCOPIC
Bilirubin Urine: NEGATIVE
Glucose, UA: NEGATIVE mg/dL
Hgb urine dipstick: NEGATIVE
Ketones, ur: NEGATIVE mg/dL
Leukocytes,Ua: NEGATIVE
Nitrite: NEGATIVE
Protein, ur: NEGATIVE mg/dL
Specific Gravity, Urine: 1.003 — ABNORMAL LOW (ref 1.005–1.030)
pH: 7 (ref 5.0–8.0)

## 2021-04-11 LAB — OSMOLALITY: Osmolality: 265 mOsm/kg — ABNORMAL LOW (ref 275–295)

## 2021-04-11 LAB — MAGNESIUM: Magnesium: 2 mg/dL (ref 1.7–2.4)

## 2021-04-11 LAB — URINE DRUG SCREEN, QUALITATIVE (ARMC ONLY)
Amphetamines, Ur Screen: NOT DETECTED
Barbiturates, Ur Screen: NOT DETECTED
Benzodiazepine, Ur Scrn: POSITIVE — AB
Cannabinoid 50 Ng, Ur ~~LOC~~: NOT DETECTED
Cocaine Metabolite,Ur ~~LOC~~: NOT DETECTED
MDMA (Ecstasy)Ur Screen: NOT DETECTED
Methadone Scn, Ur: NOT DETECTED
Opiate, Ur Screen: POSITIVE — AB
Phencyclidine (PCP) Ur S: NOT DETECTED
Tricyclic, Ur Screen: POSITIVE — AB

## 2021-04-11 LAB — NA AND K (SODIUM & POTASSIUM), RAND UR
Potassium Urine: 4 mmol/L
Sodium, Ur: <10 mmol/L

## 2021-04-11 LAB — CK: Total CK: 166 U/L (ref 49–397)

## 2021-04-11 LAB — TSH: TSH: 4.299 u[IU]/mL (ref 0.350–4.500)

## 2021-04-11 LAB — OSMOLALITY, URINE: Osmolality, Ur: 121 mOsm/kg — ABNORMAL LOW (ref 300–900)

## 2021-04-11 MED ORDER — POTASSIUM CHLORIDE 10 MEQ/100ML IV SOLN: 10.0000 meq | INTRAVENOUS | Status: DC

## 2021-04-11 MED ORDER — LORAZEPAM 2 MG/ML IJ SOLN
1.0000 mg | INTRAMUSCULAR | Status: DC | PRN
  Administered 2021-04-11 (×2): 1 mg via INTRAVENOUS
  Filled 2021-04-11 (×2): qty 1

## 2021-04-11 MED ORDER — MORPHINE SULFATE (PF) 2 MG/ML IV SOLN
2.0000 mg | INTRAVENOUS | Status: DC | PRN
  Administered 2021-04-11 – 2021-04-12 (×5): 2 mg via INTRAVENOUS
  Filled 2021-04-11 (×5): qty 1

## 2021-04-11 MED ORDER — POTASSIUM CHLORIDE IN NACL 40-0.9 MEQ/L-% IV SOLN
INTRAVENOUS | Status: DC
  Filled 2021-04-11: qty 1000

## 2021-04-11 MED ORDER — KETOROLAC TROMETHAMINE 15 MG/ML IJ SOLN
15.0000 mg | Freq: Four times a day (QID) | INTRAMUSCULAR | Status: DC | PRN
  Administered 2021-04-12 – 2021-04-15 (×4): 15 mg via INTRAVENOUS
  Filled 2021-04-11 (×6): qty 1

## 2021-04-11 MED ORDER — SODIUM CHLORIDE 1 G PO TABS
1.0000 g | ORAL_TABLET | Freq: Two times a day (BID) | ORAL | Status: DC
  Administered 2021-04-12 – 2021-04-15 (×7): 1 g via ORAL
  Filled 2021-04-11 (×8): qty 1

## 2021-04-11 MED ORDER — POTASSIUM CHLORIDE 10 MEQ/100ML IV SOLN
10.0000 meq | INTRAVENOUS | Status: AC
  Administered 2021-04-11 (×4): 10 meq via INTRAVENOUS
  Filled 2021-04-11 (×4): qty 100

## 2021-04-11 NOTE — Consult Note (Signed)
ORTHOPAEDIC CONSULTATION  REQUESTING PHYSICIAN: Marrion Coy, MD  Chief Complaint: Right hip pain  HPI: Micheal [name] is a 35 y.o. male who complains of right hip pain after a fall several days ago.  Patient has a history of seizures and apparently had 1 for 5 days ago.  After that there was no one at home with him and he did not eat or drink much for several days.  He came to the emergency room yesterday with and was found to be severely hyponatremic and have a potassium of around 2.0.  X-ray survey showed a nondisplaced crack in the tip of the right greater trochanter only.  X-rays of the back and legs were normal otherwise.  He has been in the emergency room area due to a lack of hospital beds over the last 24 hours and is been getting his electrolytes replenished and is feeling better.  He feels he needs crutches to walk however.  Past Medical History:  Diagnosis Date   Anxiety    ETOH abuse    Panic disorder    Seizures ([facility])    History reviewed. No pertinent surgical history. Social History   Socioeconomic History   Marital status: Single    Spouse name: Not on file   Number of children: Not on file   Years of education: Not on file   Highest education level: Not on file  Occupational History   Not on file  Tobacco Use   Smoking status: Some Days    Types: Cigarettes   Smokeless tobacco: Never  Substance and Sexual Activity   Alcohol use: Yes    Comment: intermittent binge drinks, hx of etoh withdraw with seizures   Drug use: No   Sexual activity: Not on file  Other Topics Concern   Not on file  Social History Narrative   Not on file   Social Determinants of Health   Financial Resource Strain: Not on file  Food Insecurity: Not on file  Transportation Needs: Not on file  Physical Activity: Not on file  Stress: Not on file  Social Connections: Not on file   History reviewed. No pertinent family history. No Known Allergies Prior to Admission medications    Medication Sig Start Date End Date Taking? Authorizing Provider  acetaminophen (TYLENOL) 325 MG tablet Take 650 mg by mouth every 6 (six) hours as needed.   Yes [provider]  ALPRAZolam Prudy Feeler) 1 MG tablet TAKE 1 TABLET BY MOUTH ONCE DAILY AS NEEDED 03/19/21  Yes Toy Cookey E, NP  busPIRone (BUSPAR) 10 MG tablet TAKE 1 TABLET BY MOUTH THREE TIMES DAILY 01/08/21  Yes Toy Cookey E, NP  doxepin (SINEQUAN) 25 MG capsule Take 1 capsule (25 mg total) by mouth at bedtime. 01/21/21  Yes Toy Cookey E, NP  folic acid (FOLVITE) 1 MG tablet Take 1 tablet (1 mg total) by mouth daily. 11/16/19  Yes Arnetha Courser, MD  mirtazapine (REMERON) 15 MG tablet Take 15 mg by mouth at bedtime. 03/19/21  Yes [provider]  thiamine 100 MG tablet Take 1 tablet (100 mg total) by mouth daily. 11/16/19  Adele Schilder, MD   DG Lumbar Spine 2-3 Views  Result Date: 04/10/2021 CLINICAL DATA:  Status post fall. EXAM: LUMBAR SPINE - 2-3 VIEW COMPARISON:  None. FINDINGS: There is no evidence of lumbar spine fracture. Alignment is normal. Intervertebral disc spaces are maintained. IMPRESSION: Negative. Electronically Signed   By: Aram Candela M.D.   On: 04/10/2021 17:18  DG Tibia/Fibula Right  Result Date: 04/10/2021 CLINICAL DATA:  Status post fall. EXAM: RIGHT TIBIA AND FIBULA - 2 VIEW COMPARISON:  None. FINDINGS: There is no evidence of fracture or other focal bone lesions. Soft tissues are unremarkable. IMPRESSION: Negative. Electronically Signed   By: Aram Candela M.D.   On: 04/10/2021 17:15   CT HEAD WO CONTRAST ( )  Result Date: 04/10/2021 CLINICAL DATA:  Pt comes into the ED via POV with his dad c/o right leg pain. Pt had a seizure 2 days ago and he fell backwards. EXAM: CT HEAD WITHOUT CONTRAST CT CERVICAL SPINE WITHOUT CONTRAST TECHNIQUE: Multidetector CT imaging of the head and cervical spine was performed following the standard protocol without intravenous  contrast. Multiplanar CT image reconstructions of the cervical spine were also generated. COMPARISON:  CT head 04/29/2005 report without imaging FINDINGS: CT HEAD FINDINGS Brain: No evidence of large-territorial acute infarction. No parenchymal hemorrhage. No mass lesion. No extra-axial collection. No mass effect or midline shift. No hydrocephalus. Basilar cisterns are patent. Vascular: No hyperdense vessel. Skull: No acute fracture or focal lesion. Sinuses/Orbits: Paranasal sinuses and mastoid air cells are clear. The orbits are unremarkable. Other: None. CT CERVICAL SPINE FINDINGS Alignment: Straightening of the cervical lordosis. Skull base and vertebrae: No acute fracture. No aggressive appearing focal osseous lesion or focal pathologic process. Soft tissues and spinal canal: No prevertebral fluid or swelling. No visible canal hematoma. Upper chest: Severe biapical pleural/pulmonary scarring. Other: None. IMPRESSION: 1. No acute intracranial abnormality. 2. No acute displaced fracture or traumatic listhesis of the cervical spine. Electronically Signed   By: Tish Frederickson M.D.   On: 04/10/2021 18:33   CT Cervical Spine Wo Contrast  Result Date: 04/10/2021 CLINICAL DATA:  Pt comes into the ED via POV with his dad c/o right leg pain. Pt had a seizure 2 days ago and he fell backwards. EXAM: CT HEAD WITHOUT CONTRAST CT CERVICAL SPINE WITHOUT CONTRAST TECHNIQUE: Multidetector CT imaging of the head and cervical spine was performed following the standard protocol without intravenous contrast. Multiplanar CT image reconstructions of the cervical spine were also generated. COMPARISON:  CT head 04/29/2005 report without imaging FINDINGS: CT HEAD FINDINGS Brain: No evidence of large-territorial acute infarction. No parenchymal hemorrhage. No mass lesion. No extra-axial collection. No mass effect or midline shift. No hydrocephalus. Basilar cisterns are patent. Vascular: No hyperdense vessel. Skull: No acute fracture  or focal lesion. Sinuses/Orbits: Paranasal sinuses and mastoid air cells are clear. The orbits are unremarkable. Other: None. CT CERVICAL SPINE FINDINGS Alignment: Straightening of the cervical lordosis. Skull base and vertebrae: No acute fracture. No aggressive appearing focal osseous lesion or focal pathologic process. Soft tissues and spinal canal: No prevertebral fluid or swelling. No visible canal hematoma. Upper chest: Severe biapical pleural/pulmonary scarring. Other: None. IMPRESSION: 1. No acute intracranial abnormality. 2. No acute displaced fracture or traumatic listhesis of the cervical spine. Electronically Signed   By: Tish Frederickson M.D.   On: 04/10/2021 18:33   DG Foot Complete Right  Result Date: 04/10/2021 CLINICAL DATA:  Status post fall. EXAM: RIGHT FOOT COMPLETE - 3+ VIEW COMPARISON:  None. FINDINGS: There is no evidence of fracture or dislocation. There is no evidence of arthropathy or other focal bone abnormality. Soft tissues are unremarkable. IMPRESSION: Negative. Electronically Signed   By: Aram Candela M.D.   On: 04/10/2021 17:16   DG FEMUR, MIN 2 VIEWS RIGHT  Result Date: 04/10/2021 CLINICAL DATA:  Status post fall. EXAM: RIGHT FEMUR 2 VIEWS COMPARISON:  None. FINDINGS: An acute, nondisplaced fracture is seen involving the greater trochanter of the proximal right femur. There is no evidence of dislocation. Soft tissues are unremarkable. IMPRESSION: Acute nondisplaced fracture of the greater trochanter of the proximal right femur. Electronically Signed   By: Aram Candela M.D.   On: 04/10/2021 17:17    Positive ROS: All other systems have been reviewed and were otherwise negative with the exception of those mentioned in the HPI and as above.  Physical Exam: General: Alert, no acute distress Cardiovascular: No pedal edema Respiratory: No cyanosis, no use of accessory musculature GI: No organomegaly, abdomen is soft and non-tender Skin: No lesions in the area of  chief complaint Neurologic: Sensation intact distally Psychiatric: Patient is competent for consent with normal mood and affect Lymphatic: No axillary or cervical lymphadenopathy  MUSCULOSKELETAL: Patient is a tall thin male in no distress.  He is actually ambulatory when I entered the room with minimal limp.  He is mildly tender over the greater trochanter on the right.  Hip motion itself is good.  Neurovascular status good distally.  Left lower extremity is normal to exam.  Back is minimally tender.  Upper extremities are normal.    Assessment: Nondisplaced hairline fracture of the right greater trochanter  Plan: Protected weightbearing with crutches. Return to clinic in 2 weeks for exam and x-rays.    Valinda Hoar, MD [phone]   04/11/2021 8:58 PM

## 2021-04-11 NOTE — ED Notes (Signed)
Complains of anxiety and pain in right hip. Patient appears anxious. Medicated per orders. No signs of distress. VSS

## 2021-04-11 NOTE — Progress Notes (Signed)
Verbal consent given by patient to call sister (Deanna) with updates. All questions and concerns addressed at this time. Deanna would like the hospitalist to call her in the morning with updates on patient's progress. Contact information added to patient's chart.  Lamonte Richer, RN

## 2021-04-11 NOTE — Progress Notes (Addendum)
PROGRESS NOTE    Micheal [name]  [id number] DOB: 1985/12/02 DOA: 04/10/2021 PCP: Pcp, No   F/U on severe hypokalemia Brief Narrative:  Micheal [name] is a 35 y.o. Caucasian male with medical history significant for anxiety and panic disorder, alcohol abuse and seizure disorder, who presented to the emergency room with acute onset of right hip pain after having a fall from bed a couple of days ago when he had a seizure episode. Upon arriving the hospital, his sodium was 123, potassium less than 2.  Was given fluids and also potassium.  X-ray showed nondisplaced right greater trochanter fracture.   Assessment & Plan:   Principal Problem:   Hyponatremia Active Problems:   Hypokalemia  Hyponatremia secondary to dehydration. Severe hypokalemia secondary to dehydration and poor p.o. intake. Patient has not been eating for the last 3 days after seizure episode.  He is severely dehydrated.  He also has severe hypokalemia. Due to acute onset of hyponatremia, and a young age, correction of sodium level relatively quickly is not a concern. The real concern is the severe hypokalemia, patient potassium still at 0.2.  This did make him at risk for cardiac arrest. Patient will be sent to progressive unit for cardiac monitoring. I will add 40 mEq potassium into normal saline, and the meantime, give additional 40 mEq IV potassium.  I will check BMP every 4 hours to make sure potassium is gradually improving. Plan has been discussed with RN, will be implemented immediately.  Right greater trochanter fracture. Patient will be seen by orthopedics today, probably not need any surgery.  Seizure disorder. Mechanical fall due to seizure. Discussed with Dr. Malen Gauze, patient need back on benzodiazepine.  Patient can follow with neurology as outpatient. Check a CK level to rule out rhabdomyolysis.  Alcohol use disorder. No evidence of withdrawal, continue thiamine folic acid.  DVT prophylaxis:  Lovenox Code Status: full Family Communication:  Disposition Plan:    Status is: Inpatient  Remains inpatient appropriate because: Severity of the medical conditions and IV treatment.        I/O last 3 completed shifts: In: 1017.2 [P.O.:400; I.V.:517.2; IV Piggyback:100] Out: 600 [Urine:600] No intake/output data recorded.     Consultants:  Neurology  Procedures: None  Antimicrobials: None  Subjective: Patient still complaining of bilateral leg cramping.  He started eating, no nausea vomiting.  No diarrhea Denies any short of breath or cough. No fever chills  No dysuria hematuria.  Objective: Vitals:   04/10/21 2239 04/10/21 2307 04/11/21 0329 04/11/21 0656  BP: 119/72 108/79 106/68 98/62  Pulse: 96 (!) 107 99 86  Resp: 20 18 18 20   Temp: 98.7 F (37.1 C)  98.3 F (36.8 C)   TempSrc: Oral  Oral   SpO2: 100% 100% 97% 94%  Weight:      Height:        Intake/Output Summary (Last 24 hours) at 04/11/2021 1111 Last data filed at 04/11/2021 0622 Gross per 24 hour  Intake 1017.21 ml  Output 600 ml  Net 417.21 ml   Filed Weights   04/10/21 1620  Weight: 68.9 kg    Examination:  General exam: Appears calm and comfortable  Respiratory system: Clear to auscultation. Respiratory effort normal. Cardiovascular system: S1 & S2 heard, RRR. No JVD, murmurs, rubs, gallops or clicks. No pedal edema. Gastrointestinal system: Abdomen is nondistended, soft and nontender. No organomegaly or masses felt. Normal bowel sounds heard. Central nervous system: Alert and oriented. No focal neurological deficits. Extremities: Symmetric  5 x 5 power. Skin: No rashes, lesions or ulcers Psychiatry: Judgement and insight appear normal. Mood & affect appropriate.     Data Reviewed: I have personally reviewed following labs and imaging studies  CBC: Recent Labs  Lab 04/10/21 1726 04/11/21 0706  WBC 10.6* 7.6  NEUTROABS 8.1*  --   HGB 12.9* 11.9*  HCT 35.8* 33.9*  MCV  90.6 92.1  PLT 196 166   Basic Metabolic Panel: Recent Labs  Lab 04/10/21 1726 04/11/21 0706  NA 123* 127*  K <2.0* 2.0*  CL 75* 84*  CO2 37* 35*  GLUCOSE 100* 98  BUN 14 9  CREATININE 0.80 0.57*  CALCIUM 9.0 8.5*  MG  --  2.0   GFR: Estimated Creatinine Clearance: 123.2 mL/min (A) (by C-G formula based on SCr of 0.57 mg/dL (L)). Liver Function Tests: Recent Labs  Lab 04/10/21 1726  AST 23  ALT 14  ALKPHOS 64  BILITOT 1.9*  PROT 8.0  ALBUMIN 4.2   No results for input(s): LIPASE, AMYLASE in the last 168 hours. No results for input(s): AMMONIA in the last 168 hours. Coagulation Profile: No results for input(s): INR, PROTIME in the last 168 hours. Cardiac Enzymes: No results for input(s): CKTOTAL, CKMB, CKMBINDEX, TROPONINI in the last 168 hours. BNP (last 3 results) No results for input(s): PROBNP in the last 8760 hours. HbA1C: No results for input(s): HGBA1C in the last 72 hours. CBG: No results for input(s): GLUCAP in the last 168 hours. Lipid Profile: No results for input(s): CHOL, HDL, LDLCALC, TRIG, CHOLHDL, LDLDIRECT in the last 72 hours. Thyroid Function Tests: Recent Labs    04/11/21 0706  TSH 4.299   Anemia Panel: No results for input(s): VITAMINB12, FOLATE, FERRITIN, TIBC, IRON, RETICCTPCT in the last 72 hours. Sepsis Labs: No results for input(s): PROCALCITON, LATICACIDVEN in the last 168 hours.  Recent Results (from the past 240 hour(s))  Resp Panel by RT-PCR (Flu A&B, Covid)     Status: None   Collection Time: 04/11/21  1:43 AM   Specimen: Nasopharyngeal(NP) swabs in vial transport medium  Result Value Ref Range Status   SARS Coronavirus 2 by RT PCR NEGATIVE NEGATIVE Final    Comment: (NOTE) SARS-CoV-2 target nucleic acids are NOT DETECTED.  The SARS-CoV-2 RNA is generally detectable in upper respiratory specimens during the acute phase of infection. The lowest concentration of SARS-CoV-2 viral copies this assay can detect is 138  copies/mL. A negative result does not preclude SARS-Cov-2 infection and should not be used as the sole basis for treatment or other patient management decisions. A negative result may occur with  improper specimen collection/handling, submission of specimen other than nasopharyngeal swab, presence of viral mutation(s) within the areas targeted by this assay, and inadequate number of viral copies(<138 copies/mL). A negative result must be combined with clinical observations, patient history, and epidemiological information. The expected result is Negative.  Fact Sheet for Patients:  BloggerCourse.com  Fact Sheet for Healthcare Providers:  SeriousBroker.it  This test is no t yet approved or cleared by the [place] FDA and  has been authorized for detection and/or diagnosis of SARS-CoV-2 by FDA under an Emergency Use Authorization (EUA). This EUA will remain  in effect (meaning this test can be used) for the duration of the COVID-19 declaration under Section 564(b)(1) of the Act, 21 U.S.C.section 360bbb-3(b)(1), unless the authorization is terminated  or revoked sooner.       Influenza A by PCR NEGATIVE NEGATIVE Final   Influenza B  by PCR NEGATIVE NEGATIVE Final    Comment: (NOTE) The Xpert Xpress SARS-CoV-2/FLU/RSV plus assay is intended as an aid in the diagnosis of influenza from Nasopharyngeal swab specimens and should not be used as a sole basis for treatment. Nasal washings and aspirates are unacceptable for Xpert Xpress SARS-CoV-2/FLU/RSV testing.  Fact Sheet for Patients: EntrepreneurPulse.com.au  Fact Sheet for Healthcare Providers: IncredibleEmployment.be  This test is not yet approved or cleared by the [place] FDA and has been authorized for detection and/or diagnosis of SARS-CoV-2 by FDA under an Emergency Use Authorization (EUA). This EUA will remain in effect (meaning  this test can be used) for the duration of the COVID-19 declaration under Section 564(b)(1) of the Act, 21 U.S.C. section 360bbb-3(b)(1), unless the authorization is terminated or revoked.  Performed at [facility], [address], [place], [place] [address]          Radiology Studies: DG Lumbar Spine 2-3 Views  Result Date: 04/10/2021 CLINICAL DATA:  Status post fall. EXAM: LUMBAR SPINE - 2-3 VIEW COMPARISON:  None. FINDINGS: There is no evidence of lumbar spine fracture. Alignment is normal. Intervertebral disc spaces are maintained. IMPRESSION: Negative. Electronically Signed   By: Virgina Norfolk M.D.   On: 04/10/2021 17:18   DG Tibia/Fibula Right  Result Date: 04/10/2021 CLINICAL DATA:  Status post fall. EXAM: RIGHT TIBIA AND FIBULA - 2 VIEW COMPARISON:  None. FINDINGS: There is no evidence of fracture or other focal bone lesions. Soft tissues are unremarkable. IMPRESSION: Negative. Electronically Signed   By: Virgina Norfolk M.D.   On: 04/10/2021 17:15   CT HEAD WO CONTRAST (5MM)  Result Date: 04/10/2021 CLINICAL DATA:  Micheal [name] comes into the ED via POV with his dad c/o right leg pain. Micheal [name] had a seizure 2 days ago and he fell backwards. EXAM: CT HEAD WITHOUT CONTRAST CT CERVICAL SPINE WITHOUT CONTRAST TECHNIQUE: Multidetector CT imaging of the head and cervical spine was performed following the standard protocol without intravenous contrast. Multiplanar CT image reconstructions of the cervical spine were also generated. COMPARISON:  CT head 04/29/2005 report without imaging FINDINGS: CT HEAD FINDINGS Brain: No evidence of large-territorial acute infarction. No parenchymal hemorrhage. No mass lesion. No extra-axial collection. No mass effect or midline shift. No hydrocephalus. Basilar cisterns are patent. Vascular: No hyperdense vessel. Skull: No acute fracture or focal lesion. Sinuses/Orbits: Paranasal sinuses and mastoid air cells are clear. The orbits are unremarkable.  Other: None. CT CERVICAL SPINE FINDINGS Alignment: Straightening of the cervical lordosis. Skull base and vertebrae: No acute fracture. No aggressive appearing focal osseous lesion or focal pathologic process. Soft tissues and spinal canal: No prevertebral fluid or swelling. No visible canal hematoma. Upper chest: Severe biapical pleural/pulmonary scarring. Other: None. IMPRESSION: 1. No acute intracranial abnormality. 2. No acute displaced fracture or traumatic listhesis of the cervical spine. Electronically Signed   By: Iven Finn M.D.   On: 04/10/2021 18:33   CT Cervical Spine Wo Contrast  Result Date: 04/10/2021 CLINICAL DATA:  Micheal [name] comes into the ED via POV with his dad c/o right leg pain. Micheal [name] had a seizure 2 days ago and he fell backwards. EXAM: CT HEAD WITHOUT CONTRAST CT CERVICAL SPINE WITHOUT CONTRAST TECHNIQUE: Multidetector CT imaging of the head and cervical spine was performed following the standard protocol without intravenous contrast. Multiplanar CT image reconstructions of the cervical spine were also generated. COMPARISON:  CT head 04/29/2005 report without imaging FINDINGS: CT HEAD FINDINGS Brain: No evidence of large-territorial acute infarction. No parenchymal  hemorrhage. No mass lesion. No extra-axial collection. No mass effect or midline shift. No hydrocephalus. Basilar cisterns are patent. Vascular: No hyperdense vessel. Skull: No acute fracture or focal lesion. Sinuses/Orbits: Paranasal sinuses and mastoid air cells are clear. The orbits are unremarkable. Other: None. CT CERVICAL SPINE FINDINGS Alignment: Straightening of the cervical lordosis. Skull base and vertebrae: No acute fracture. No aggressive appearing focal osseous lesion or focal pathologic process. Soft tissues and spinal canal: No prevertebral fluid or swelling. No visible canal hematoma. Upper chest: Severe biapical pleural/pulmonary scarring. Other: None. IMPRESSION: 1. No acute intracranial abnormality. 2. No acute  displaced fracture or traumatic listhesis of the cervical spine. Electronically Signed   By: Iven Finn M.D.   On: 04/10/2021 18:33   DG Foot Complete Right  Result Date: 04/10/2021 CLINICAL DATA:  Status post fall. EXAM: RIGHT FOOT COMPLETE - 3+ VIEW COMPARISON:  None. FINDINGS: There is no evidence of fracture or dislocation. There is no evidence of arthropathy or other focal bone abnormality. Soft tissues are unremarkable. IMPRESSION: Negative. Electronically Signed   By: Virgina Norfolk M.D.   On: 04/10/2021 17:16   DG FEMUR, MIN 2 VIEWS RIGHT  Result Date: 04/10/2021 CLINICAL DATA:  Status post fall. EXAM: RIGHT FEMUR 2 VIEWS COMPARISON:  None. FINDINGS: An acute, nondisplaced fracture is seen involving the greater trochanter of the proximal right femur. There is no evidence of dislocation. Soft tissues are unremarkable. IMPRESSION: Acute nondisplaced fracture of the greater trochanter of the proximal right femur. Electronically Signed   By: Virgina Norfolk M.D.   On: 04/10/2021 17:17        Scheduled Meds:  busPIRone  10 mg Oral TID   doxepin  25 mg Oral QHS   enoxaparin (LOVENOX) injection  40 mg Subcutaneous [id number]   folic acid  1 mg Oral Daily   mirtazapine  15 mg Oral QHS   thiamine  100 mg Oral Daily   Continuous Infusions:  0.9 % NaCl with KCl 40 mEq / L     potassium chloride       LOS: 1 day    Time spent: 32 minutes    Sharen Hones, MD [facility]   To contact the attending provider between 7A-7P or the covering provider during after hours 7P-7A, please log into the web site [email or web address] and access using universal [place] password for that web site. If you do not have the password, please call the hospital operator.  04/11/2021, 11:11 AM

## 2021-04-11 NOTE — ED Notes (Signed)
Resting with eyes closed. No signs of distress.

## 2021-04-11 NOTE — ED Notes (Signed)
Orthopedic MD at bedside at this time.

## 2021-04-11 NOTE — ED Notes (Signed)
Ate 100% of breakfast. No signs of distress. Resting quietly.

## 2021-04-12 LAB — CBC WITH DIFFERENTIAL/PLATELET
Abs Immature Granulocytes: 0.02 10*3/uL (ref 0.00–0.07)
Basophils Absolute: 0 10*3/uL (ref 0.0–0.1)
Basophils Relative: 0 %
Eosinophils Absolute: 0.1 10*3/uL (ref 0.0–0.5)
Eosinophils Relative: 1 %
HCT: 33.1 % — ABNORMAL LOW (ref 39.0–52.0)
Hemoglobin: 11.6 g/dL — ABNORMAL LOW (ref 13.0–17.0)
Immature Granulocytes: 0 %
Lymphocytes Relative: 17 %
Lymphs Abs: 1.1 10*3/uL (ref 0.7–4.0)
MCH: 32.8 pg (ref 26.0–34.0)
MCHC: 35 g/dL (ref 30.0–36.0)
MCV: 93.5 fL (ref 80.0–100.0)
Monocytes Absolute: 1.1 10*3/uL — ABNORMAL HIGH (ref 0.1–1.0)
Monocytes Relative: 17 %
Neutro Abs: 4.4 10*3/uL (ref 1.7–7.7)
Neutrophils Relative %: 65 %
Platelets: 188 10*3/uL (ref 150–400)
RBC: 3.54 MIL/uL — ABNORMAL LOW (ref 4.22–5.81)
RDW: 12.9 % (ref 11.5–15.5)
WBC: 6.8 10*3/uL (ref 4.0–10.5)
nRBC: 0 % (ref 0.0–0.2)

## 2021-04-12 LAB — BASIC METABOLIC PANEL
Anion gap: 8 (ref 5–15)
BUN: <5 mg/dL — ABNORMAL LOW (ref 6–20)
CO2: 35 mmol/L — ABNORMAL HIGH (ref 22–32)
Calcium: 9.1 mg/dL (ref 8.9–10.3)
Chloride: 89 mmol/L — ABNORMAL LOW (ref 98–111)
Creatinine, Ser: 0.57 mg/dL — ABNORMAL LOW (ref 0.61–1.24)
GFR, Estimated: >60 mL/min (ref >60)
Glucose, Bld: 100 mg/dL — ABNORMAL HIGH (ref 70–99)
Potassium: 2.7 mmol/L — CL (ref 3.5–5.1)
Sodium: 132 mmol/L — ABNORMAL LOW (ref 135–145)

## 2021-04-12 LAB — PHOSPHORUS: Phosphorus: 3.5 mg/dL (ref 2.5–4.6)

## 2021-04-12 LAB — POTASSIUM: Potassium: 2.8 mmol/L — ABNORMAL LOW (ref 3.5–5.1)

## 2021-04-12 LAB — MAGNESIUM: Magnesium: 1.8 mg/dL (ref 1.7–2.4)

## 2021-04-12 MED ORDER — ALPRAZOLAM 1 MG PO TABS: 1.0000 mg | ORAL_TABLET | Freq: Every day | ORAL | Status: DC

## 2021-04-12 MED ORDER — POTASSIUM CHLORIDE 10 MEQ/100ML IV SOLN
10.0000 meq | INTRAVENOUS | Status: AC
  Administered 2021-04-12 (×4): 10 meq via INTRAVENOUS
  Filled 2021-04-12: qty 100

## 2021-04-12 MED ORDER — ALPRAZOLAM 1 MG PO TABS
1.0000 mg | ORAL_TABLET | Freq: Every day | ORAL | Status: DC
  Administered 2021-04-12 – 2021-04-15 (×4): 1 mg via ORAL
  Filled 2021-04-12 (×4): qty 1

## 2021-04-12 MED ORDER — POTASSIUM CHLORIDE 10 MEQ/100ML IV SOLN
10.0000 meq | INTRAVENOUS | Status: AC
  Administered 2021-04-12 (×4): 10 meq via INTRAVENOUS
  Filled 2021-04-12 (×2): qty 100

## 2021-04-12 NOTE — Progress Notes (Signed)
This patient has a history of right hairline hip fracture causing him increased pain.  Morphine and Xanax was given during the day with minimal relief.  Toradol given this evening for unrelieved pain management.  Patient is convinced he needs crutches, that have been ordered, however can ambulate to bathroom independently.  He has received 3 bags of Potassium chloride, 4th bad currently infusing.  Potassium level was 2.8 so 4 additional bags have been ordered.  Patient has a urinal at bedside due to comitant IV infusions.

## 2021-04-12 NOTE — Progress Notes (Signed)
PROGRESS NOTE    MICHAELANGELO MITTELMAN  RWE:[id number] DOB: [birth year] DOA: 04/10/2021 PCP: Oneita Hurt, No    Brief Narrative:  KESLER WICKHAM is a 35 y.o. Caucasian male with medical history significant for anxiety and panic disorder, alcohol abuse and seizure disorder, who presented to the emergency room with acute onset of right hip pain after having a fall from bed a couple of days ago when he had a seizure episode. Upon arriving the hospital, his sodium was 123, potassium less than 2.  Was given fluids and also potassium.  X-ray showed nondisplaced right greater trochanter fracture.   Assessment & Plan:   Principal Problem:   Hyponatremia Active Problems:   Hypokalemia   Fall due to seizure (HCC)  Hyponatremia secondary to dehydration. Severe hypokalemia secondary to dehydration and poor p.o. intake. Patient condition is improving, potassium increased to 2.7 today, will continue IV infusion.  Monitor potassium level.  Not able to discharge due to persistent hypokalemia.  Sodium level improving, continue oral sodium chloride tablet.   Right greater trochanter fracture. Seen by orthopedics, follow-up with them as outpatient.  Seizure disorder. Mechanical fall due to seizure. Continue Xanax 1 mg every evening.   Alcohol use disorder. No evidence of withdrawal, continue thiamine folic acid.   DVT prophylaxis: Lovenox Code Status: full Family Communication:  Disposition Plan:      Status is: Inpatient   Remains inpatient appropriate because: Severity of the medical conditions and IV treatment.         I/O last 3 completed shifts: In: 3480.6 [P.O.:400; I.V.:1580.6; IV Piggyback:1500] Out: 600 [Urine:600] No intake/output data recorded.     Consultants:  orthopedics  Procedures: None  Antimicrobials: None  Subjective: Patient doing well today, no nausea vomiting abdominal pain. Pain under control. No short of breath or cough. No fever or  chills.  Objective: Vitals:   04/11/21 1600 04/11/21 1906 04/11/21 2206 04/12/21 0759  BP: 124/68 114/76 118/73 130/88  Pulse: 84 83 85 90  Resp: 19 18 20 16   Temp: 98 F (36.7 C) 98.9 F (37.2 C) 98.7 F (37.1 C) 98.2 F (36.8 C)  TempSrc:  Oral Oral Oral  SpO2: 100% 100% 100% 99%  Weight:      Height:        Intake/Output Summary (Last 24 hours) at 04/12/2021 0954 Last data filed at 04/12/2021 0326 Gross per 24 hour  Intake 2463.42 ml  Output --  Net 2463.42 ml   Filed Weights   04/10/21 1620  Weight: 68.9 kg    Examination:  General exam: Appears calm and comfortable  Respiratory system: Clear to auscultation. Respiratory effort normal. Cardiovascular system: S1 & S2 heard, RRR. No JVD, murmurs, rubs, gallops or clicks. No pedal edema. Gastrointestinal system: Abdomen is nondistended, soft and nontender. No organomegaly or masses felt. Normal bowel sounds heard. Central nervous system: Alert and oriented. No focal neurological deficits. Extremities: Symmetric 5 x 5 power. Skin: No rashes, lesions or ulcers Psychiatry: Judgement and insight appear normal. Mood & affect appropriate.     Data Reviewed: I have personally reviewed following labs and imaging studies  CBC: Recent Labs  Lab 04/10/21 1726 04/11/21 0706 04/12/21 0900  WBC 10.6* 7.6 6.8  NEUTROABS 8.1*  --  4.4  HGB 12.9* 11.9* 11.6*  HCT 35.8* 33.9* 33.1*  MCV 90.6 92.1 93.5  PLT 196 166 188   Basic Metabolic Panel: Recent Labs  Lab 04/10/21 1726 04/11/21 0706 04/11/21 1803 04/12/21 0900  NA 123* 127* 129*  132*  K <2.0* 2.0* 4.1 2.7*  CL 75* 84* 89* 89*  CO2 37* 35* 31 35*  GLUCOSE 100* 98 87 100*  BUN 14 9 6  <5*  CREATININE 0.80 0.57* 0.61 0.57*  CALCIUM 9.0 8.5* 8.3* 9.1  MG  --  2.0  --  1.8  PHOS  --   --   --  3.5   GFR: Estimated Creatinine Clearance: 123.2 mL/min (A) (by C-G formula based on SCr of 0.57 mg/dL (L)). Liver Function Tests: Recent Labs  Lab 04/10/21 1726   AST 23  ALT 14  ALKPHOS 64  BILITOT 1.9*  PROT 8.0  ALBUMIN 4.2   No results for input(s): LIPASE, AMYLASE in the last 168 hours. No results for input(s): AMMONIA in the last 168 hours. Coagulation Profile: No results for input(s): INR, PROTIME in the last 168 hours. Cardiac Enzymes: Recent Labs  Lab 04/11/21 1804  CKTOTAL 166   BNP (last 3 results) No results for input(s): PROBNP in the last 8760 hours. HbA1C: No results for input(s): HGBA1C in the last 72 hours. CBG: No results for input(s): GLUCAP in the last 168 hours. Lipid Profile: No results for input(s): CHOL, HDL, LDLCALC, TRIG, CHOLHDL, LDLDIRECT in the last 72 hours. Thyroid Function Tests: Recent Labs    04/11/21 0706  TSH 4.299   Anemia Panel: No results for input(s): VITAMINB12, FOLATE, FERRITIN, TIBC, IRON, RETICCTPCT in the last 72 hours. Sepsis Labs: No results for input(s): PROCALCITON, LATICACIDVEN in the last 168 hours.  Recent Results (from the past 240 hour(s))  Resp Panel by RT-PCR (Flu A&B, Covid)     Status: None   Collection Time: 04/11/21  1:43 AM   Specimen: Nasopharyngeal(NP) swabs in vial transport medium  Result Value Ref Range Status   SARS Coronavirus 2 by RT PCR NEGATIVE NEGATIVE Final    Comment: (NOTE) SARS-CoV-2 target nucleic acids are NOT DETECTED.  The SARS-CoV-2 RNA is generally detectable in upper respiratory specimens during the acute phase of infection. The lowest concentration of SARS-CoV-2 viral copies this assay can detect is 138 copies/mL. A negative result does not preclude SARS-Cov-2 infection and should not be used as the sole basis for treatment or other patient management decisions. A negative result may occur with  improper specimen collection/handling, submission of specimen other than nasopharyngeal swab, presence of viral mutation(s) within the areas targeted by this assay, and inadequate number of viral copies(<138 copies/mL). A negative result must be  combined with clinical observations, patient history, and epidemiological information. The expected result is Negative.  Fact Sheet for Patients:  04/13/21  Fact Sheet for Healthcare Providers:  BloggerCourse.com  This test is no t yet approved or cleared by the [place] FDA and  has been authorized for detection and/or diagnosis of SARS-CoV-2 by FDA under an Emergency Use Authorization (EUA). This EUA will remain  in effect (meaning this test can be used) for the duration of the COVID-19 declaration under Section 564(b)(1) of the Act, 21 U.S.C.section 360bbb-3(b)(1), unless the authorization is terminated  or revoked sooner.       Influenza A by PCR NEGATIVE NEGATIVE Final   Influenza B by PCR NEGATIVE NEGATIVE Final    Comment: (NOTE) The Xpert Xpress SARS-CoV-2/FLU/RSV plus assay is intended as an aid in the diagnosis of influenza from Nasopharyngeal swab specimens and should not be used as a sole basis for treatment. Nasal washings and aspirates are unacceptable for Xpert Xpress SARS-CoV-2/FLU/RSV testing.  Fact Sheet for Patients: [place]  Fact Sheet for Healthcare Providers: SeriousBroker.it  This test is not yet approved or cleared by the [place] FDA and has been authorized for detection and/or diagnosis of SARS-CoV-2 by FDA under an Emergency Use Authorization (EUA). This EUA will remain in effect (meaning this test can be used) for the duration of the COVID-19 declaration under Section 564(b)(1) of the Act, 21 U.S.C. section 360bbb-3(b)(1), unless the authorization is terminated or revoked.  Performed at [facility], [address] [place], [place] [address]          Radiology Studies: DG Lumbar Spine 2-3 Views  Result Date: 04/10/2021 CLINICAL DATA:  Status post fall. EXAM: LUMBAR SPINE - 2-3 VIEW COMPARISON:   None. FINDINGS: There is no evidence of lumbar spine fracture. Alignment is normal. Intervertebral disc spaces are maintained. IMPRESSION: Negative. Electronically Signed   By: Aram Candela M.D.   On: 04/10/2021 17:18   DG Tibia/Fibula Right  Result Date: 04/10/2021 CLINICAL DATA:  Status post fall. EXAM: RIGHT TIBIA AND FIBULA - 2 VIEW COMPARISON:  None. FINDINGS: There is no evidence of fracture or other focal bone lesions. Soft tissues are unremarkable. IMPRESSION: Negative. Electronically Signed   By: Aram Candela M.D.   On: 04/10/2021 17:15   CT HEAD WO CONTRAST ( )  Result Date: 04/10/2021 CLINICAL DATA:  Pt comes into the ED via POV with his dad c/o right leg pain. Pt had a seizure 2 days ago and he fell backwards. EXAM: CT HEAD WITHOUT CONTRAST CT CERVICAL SPINE WITHOUT CONTRAST TECHNIQUE: Multidetector CT imaging of the head and cervical spine was performed following the standard protocol without intravenous contrast. Multiplanar CT image reconstructions of the cervical spine were also generated. COMPARISON:  CT head 04/29/2005 report without imaging FINDINGS: CT HEAD FINDINGS Brain: No evidence of large-territorial acute infarction. No parenchymal hemorrhage. No mass lesion. No extra-axial collection. No mass effect or midline shift. No hydrocephalus. Basilar cisterns are patent. Vascular: No hyperdense vessel. Skull: No acute fracture or focal lesion. Sinuses/Orbits: Paranasal sinuses and mastoid air cells are clear. The orbits are unremarkable. Other: None. CT CERVICAL SPINE FINDINGS Alignment: Straightening of the cervical lordosis. Skull base and vertebrae: No acute fracture. No aggressive appearing focal osseous lesion or focal pathologic process. Soft tissues and spinal canal: No prevertebral fluid or swelling. No visible canal hematoma. Upper chest: Severe biapical pleural/pulmonary scarring. Other: None. IMPRESSION: 1. No acute intracranial abnormality. 2. No acute displaced  fracture or traumatic listhesis of the cervical spine. Electronically Signed   By: Tish Frederickson M.D.   On: 04/10/2021 18:33   CT Cervical Spine Wo Contrast  Result Date: 04/10/2021 CLINICAL DATA:  Pt comes into the ED via POV with his dad c/o right leg pain. Pt had a seizure 2 days ago and he fell backwards. EXAM: CT HEAD WITHOUT CONTRAST CT CERVICAL SPINE WITHOUT CONTRAST TECHNIQUE: Multidetector CT imaging of the head and cervical spine was performed following the standard protocol without intravenous contrast. Multiplanar CT image reconstructions of the cervical spine were also generated. COMPARISON:  CT head 04/29/2005 report without imaging FINDINGS: CT HEAD FINDINGS Brain: No evidence of large-territorial acute infarction. No parenchymal hemorrhage. No mass lesion. No extra-axial collection. No mass effect or midline shift. No hydrocephalus. Basilar cisterns are patent. Vascular: No hyperdense vessel. Skull: No acute fracture or focal lesion. Sinuses/Orbits: Paranasal sinuses and mastoid air cells are clear. The orbits are unremarkable. Other: None. CT CERVICAL SPINE FINDINGS Alignment: Straightening of the cervical lordosis. Skull base and vertebrae:  No acute fracture. No aggressive appearing focal osseous lesion or focal pathologic process. Soft tissues and spinal canal: No prevertebral fluid or swelling. No visible canal hematoma. Upper chest: Severe biapical pleural/pulmonary scarring. Other: None. IMPRESSION: 1. No acute intracranial abnormality. 2. No acute displaced fracture or traumatic listhesis of the cervical spine. Electronically Signed   By: Tish Frederickson M.D.   On: 04/10/2021 18:33   DG Foot Complete Right  Result Date: 04/10/2021 CLINICAL DATA:  Status post fall. EXAM: RIGHT FOOT COMPLETE - 3+ VIEW COMPARISON:  None. FINDINGS: There is no evidence of fracture or dislocation. There is no evidence of arthropathy or other focal bone abnormality. Soft tissues are unremarkable.  IMPRESSION: Negative. Electronically Signed   By: Aram Candela M.D.   On: 04/10/2021 17:16   DG FEMUR, MIN 2 VIEWS RIGHT  Result Date: 04/10/2021 CLINICAL DATA:  Status post fall. EXAM: RIGHT FEMUR 2 VIEWS COMPARISON:  None. FINDINGS: An acute, nondisplaced fracture is seen involving the greater trochanter of the proximal right femur. There is no evidence of dislocation. Soft tissues are unremarkable. IMPRESSION: Acute nondisplaced fracture of the greater trochanter of the proximal right femur. Electronically Signed   By: Aram Candela M.D.   On: 04/10/2021 17:17        Scheduled Meds:  ALPRAZolam  1 mg Oral QHS   busPIRone  10 mg Oral TID   doxepin  25 mg Oral QHS   enoxaparin (LOVENOX) injection  40 mg Subcutaneous Q24H   folic acid  1 mg Oral Daily   mirtazapine  15 mg Oral QHS   sodium chloride  1 g Oral BID WC   thiamine  100 mg Oral Daily   Continuous Infusions:  potassium chloride       LOS: 2 days    Time spent: 28 minutes    Marrion Coy, MD [facility]   To contact the attending provider between 7A-7P or the covering provider during after hours 7P-7A, please log into the web site [email or web address] and access using universal [place] password for that web site. If you do not have the password, please call the hospital operator.  04/12/2021, 9:54 AM

## 2021-04-13 LAB — BASIC METABOLIC PANEL
Anion gap: 7 (ref 5–15)
BUN: 6 mg/dL (ref 6–20)
CO2: 30 mmol/L (ref 22–32)
Calcium: 8.7 mg/dL — ABNORMAL LOW (ref 8.9–10.3)
Chloride: 95 mmol/L — ABNORMAL LOW (ref 98–111)
Creatinine, Ser: 0.56 mg/dL — ABNORMAL LOW (ref 0.61–1.24)
GFR, Estimated: >60 mL/min (ref >60)
Glucose, Bld: 101 mg/dL — ABNORMAL HIGH (ref 70–99)
Potassium: 2.5 mmol/L — CL (ref 3.5–5.1)
Sodium: 132 mmol/L — ABNORMAL LOW (ref 135–145)

## 2021-04-13 LAB — MAGNESIUM
Magnesium: 1.7 mg/dL (ref 1.7–2.4)
Magnesium: 1.4 mg/dL — ABNORMAL LOW (ref 1.7–2.4)

## 2021-04-13 LAB — POTASSIUM: Potassium: 2.9 mmol/L — ABNORMAL LOW (ref 3.5–5.1)

## 2021-04-13 MED ORDER — MAGNESIUM SULFATE 2 GM/50ML IV SOLN
2.0000 g | Freq: Once | INTRAVENOUS | Status: AC
  Administered 2021-04-13: 2 g via INTRAVENOUS
  Filled 2021-04-13: qty 50

## 2021-04-13 MED ORDER — ORAL CARE MOUTH RINSE
15.0000 mL | Freq: Two times a day (BID) | OROMUCOSAL | Status: DC
  Administered 2021-04-14 – 2021-04-15 (×3): 15 mL via OROMUCOSAL

## 2021-04-13 MED ORDER — LOPERAMIDE HCL 2 MG PO CAPS: 2.0000 mg | ORAL_CAPSULE | ORAL | Status: DC | PRN

## 2021-04-13 MED ORDER — POTASSIUM CHLORIDE IN NACL 40-0.9 MEQ/L-% IV SOLN
INTRAVENOUS | Status: DC
  Filled 2021-04-13 (×4): qty 1000

## 2021-04-13 MED ORDER — POTASSIUM CHLORIDE 10 MEQ/100ML IV SOLN
10.0000 meq | INTRAVENOUS | Status: AC
  Administered 2021-04-13 – 2021-04-14 (×3): 10 meq via INTRAVENOUS
  Filled 2021-04-13: qty 100

## 2021-04-13 MED ORDER — POTASSIUM CHLORIDE 10 MEQ/100ML IV SOLN
10.0000 meq | INTRAVENOUS | Status: AC
  Administered 2021-04-13 (×3): 10 meq via INTRAVENOUS

## 2021-04-13 MED ORDER — POTASSIUM CHLORIDE 10 MEQ/100ML IV SOLN
10.0000 meq | INTRAVENOUS | Status: DC
  Administered 2021-04-13: 14:00:00 10 meq via INTRAVENOUS

## 2021-04-13 MED ORDER — MORPHINE SULFATE (PF) 2 MG/ML IV SOLN
2.0000 mg | INTRAVENOUS | Status: DC | PRN
  Administered 2021-04-14: 2 mg via INTRAVENOUS
  Filled 2021-04-13: qty 1

## 2021-04-13 MED ORDER — MAGIC MOUTHWASH
10.0000 mL | Freq: Four times a day (QID) | ORAL | Status: DC | PRN
  Administered 2021-04-13 – 2021-04-14 (×2): 10 mL via ORAL
  Filled 2021-04-13 (×2): qty 10

## 2021-04-13 MED ORDER — POTASSIUM CHLORIDE CRYS ER 20 MEQ PO TBCR
40.0000 meq | EXTENDED_RELEASE_TABLET | Freq: Once | ORAL | Status: AC
  Administered 2021-04-13: 40 meq via ORAL
  Filled 2021-04-13: qty 2

## 2021-04-13 MED ORDER — POTASSIUM CHLORIDE 10 MEQ/100ML IV SOLN
10.0000 meq | INTRAVENOUS | Status: DC
  Filled 2021-04-13 (×4): qty 100

## 2021-04-13 NOTE — Progress Notes (Signed)
Patient continues to experience decreased potassium requiring consistence IV infusions. PIV has been replaced.  Patient reports showering independently in bathroom.  Patient has not requested po pain medication this shift.

## 2021-04-13 NOTE — Progress Notes (Signed)
   04/13/21 0515  Provider Notification  Provider Name/Title Valente David MD  Date Provider Notified 04/13/21  Time Provider Notified [phone]  Notification Type Page  Notification Reason Critical result  Test performed and critical result potassium 2.5  Date Critical Result Received 04/13/21  Time Critical Result Received 0512  Provider response See new orders  Date of Provider Response 04/13/21  Time of Provider Response [phone]

## 2021-04-13 NOTE — Plan of Care (Signed)
Alert and oriented x4. VSS, remained on room air. Persistent hypokalemia, potassium replaced IV and po. Pain managed with prn medications. Denies n/v. OOB for bathroom use using crutches independently, "it is easier to walk with the crutches," per patient. Bed low and in locked position. Call bell within reach and able to use.   Problem: Health Behavior/Discharge Planning: Goal: Ability to manage health-related needs will improve Outcome: Progressing   Problem: Education: Goal: Knowledge of General Education information will improve Description: Including pain rating scale, medication(s)/side effects and non-pharmacologic comfort measures Outcome: Progressing   Problem: Clinical Measurements: Goal: Ability to maintain clinical measurements within normal limits will improve Outcome: Progressing   Problem: Clinical Measurements: Goal: Diagnostic test results will improve Outcome: Progressing   Problem: Activity: Goal: Risk for activity intolerance will decrease Outcome: Progressing   Problem: Pain Managment: Goal: General experience of comfort will improve Outcome: Progressing   Problem: Safety: Goal: Ability to remain free from injury will improve Outcome: Progressing

## 2021-04-13 NOTE — Progress Notes (Signed)
PROGRESS NOTE    Micheal [name]  WLN:[id number] DOB: [birth year] DOA: 04/10/2021 PCP: Oneita Hurt, No    Brief Narrative:  Micheal [name] is a 35 y.o. Caucasian male with medical history significant for anxiety and panic disorder, alcohol abuse and seizure disorder, who presented to the emergency room with acute onset of right hip pain after having a fall from bed a couple of days ago when he had a seizure episode. Upon arriving the hospital, his sodium was 123, potassium less than 2.  Was given fluids and also potassium.  X-ray showed nondisplaced right greater trochanter fracture.   Assessment & Plan:   Principal Problem:   Hyponatremia Active Problems:   Hypokalemia   Fall due to seizure (HCC)  Hyponatremia secondary to dehydration. Severe hypokalemia secondary to dehydration and poor p.o. intake. Patient still has severe hypokalemia.  I ordered IV potassium infusion early in the morning, but he lost IV.  IV team is putting another IV line.  I reordered potassium to make sure it does not expire.  I will give her 40 mEq of KCl, I will also give normal saline with 40 Meq/L of kcl for 100 mL/h. Continue monitor potassium level. Magnesium level 1.7, will give 2 g of magnesium sulfate.  Right greater trochanter fracture. Seizure disorder. Mechanical fall due to seizure. Continue Xanax 1 mg daily. Patient does not need surgery, will continue pain medicine as needed.  Alcohol use disorder. Continue thiamine and folic acid, patient does not have any evidence of alcohol withdrawal.   DVT prophylaxis: Lovenox Code Status: full Family Communication:  Disposition Plan:      Status is: Inpatient   Remains inpatient appropriate because: Severity of the medical conditions and IV treatment.         I/O last 3 completed shifts: In: 3663.4 [P.O.:1200; I.V.:1063.4; IV Piggyback:1400] Out: 1350 [Urine:1350] Total I/O In: 200 [P.O.:200] Out: -     Subjective: Patient doing well  today, he still has some loose stool.  Added Imodium.  No nausea vomiting, tolerating diet. Denies any fever chills pain No palpitation or chest pain. No short of breath or cough. No dysuria or hematuria.  Objective: Vitals:   04/12/21 2104 04/13/21 0501 04/13/21 0800 04/13/21 1108  BP: 116/81 109/76 116/80 131/79  Pulse: 89 93 100 (!) 102  Resp: 17 16 18 20   Temp: 98.4 F (36.9 C) 99.4 F (37.4 C) 99.5 F (37.5 C) 99.4 F (37.4 C)  TempSrc: Oral  Oral Oral  SpO2: 99% 96% 100% 100%  Weight:      Height:        Intake/Output Summary (Last 24 hours) at 04/13/2021 1223 Last data filed at 04/13/2021 1000 Gross per 24 hour  Intake 1560 ml  Output 1350 ml  Net 210 ml   Filed Weights   04/10/21 1620  Weight: 68.9 kg    Examination:  General exam: Appears calm and comfortable  Respiratory system: Clear to auscultation. Respiratory effort normal. Cardiovascular system: S1 & S2 heard, RRR. No JVD, murmurs, rubs, gallops or clicks. No pedal edema. Gastrointestinal system: Abdomen is nondistended, soft and nontender. No organomegaly or masses felt. Normal bowel sounds heard. Central nervous system: Alert and oriented. No focal neurological deficits. Extremities: Symmetric 5 x 5 power. Skin: No rashes, lesions or ulcers Psychiatry: Judgement and insight appear normal. Mood & affect appropriate.     Data Reviewed: I have personally reviewed following labs and imaging studies  CBC: Recent Labs  Lab 04/10/21 1726 04/11/21  [id number] 04/12/21 0900  WBC 10.6* 7.6 6.8  NEUTROABS 8.1*  --  4.4  HGB 12.9* 11.9* 11.6*  HCT 35.8* 33.9* 33.1*  MCV 90.6 92.1 93.5  PLT 196 166 0000000   Basic Metabolic Panel: Recent Labs  Lab 04/10/21 1726 04/11/21 0706 04/11/21 1803 04/12/21 0900 04/12/21 1554 04/13/21 0345 04/13/21 0700  NA 123* 127* 129* 132*  --  132*  --   K <2.0* 2.0* 4.1 2.7* 2.8* 2.5*  --   CL 75* 84* 89* 89*  --  95*  --   CO2 37* 35* 31 35*  --  30  --   GLUCOSE  100* 98 87 100*  --  101*  --   BUN 14 9 6  <5*  --  6  --   CREATININE 0.80 0.57* 0.61 0.57*  --  0.56*  --   CALCIUM 9.0 8.5* 8.3* 9.1  --  8.7*  --   MG  --  2.0  --  1.8  --  1.7 1.4*  PHOS  --   --   --  3.5  --   --   --    GFR: Estimated Creatinine Clearance: 123.2 mL/min (A) (by C-G formula based on SCr of 0.56 mg/dL (L)). Liver Function Tests: Recent Labs  Lab 04/10/21 1726  AST 23  ALT 14  ALKPHOS 64  BILITOT 1.9*  PROT 8.0  ALBUMIN 4.2   No results for input(s): LIPASE, AMYLASE in the last 168 hours. No results for input(s): AMMONIA in the last 168 hours. Coagulation Profile: No results for input(s): INR, PROTIME in the last 168 hours. Cardiac Enzymes: Recent Labs  Lab 04/11/21 1804  CKTOTAL 166   BNP (last 3 results) No results for input(s): PROBNP in the last 8760 hours. HbA1C: No results for input(s): HGBA1C in the last 72 hours. CBG: No results for input(s): GLUCAP in the last 168 hours. Lipid Profile: No results for input(s): CHOL, HDL, LDLCALC, TRIG, CHOLHDL, LDLDIRECT in the last 72 hours. Thyroid Function Tests: Recent Labs    04/11/21 0706  TSH 4.299   Anemia Panel: No results for input(s): VITAMINB12, FOLATE, FERRITIN, TIBC, IRON, RETICCTPCT in the last 72 hours. Sepsis Labs: No results for input(s): PROCALCITON, LATICACIDVEN in the last 168 hours.  Recent Results (from the past 240 hour(s))  Resp Panel by RT-PCR (Flu A&B, Covid)     Status: None   Collection Time: 04/11/21  1:43 AM   Specimen: Nasopharyngeal(NP) swabs in vial transport medium  Result Value Ref Range Status   SARS Coronavirus 2 by RT PCR NEGATIVE NEGATIVE Final    Comment: (NOTE) SARS-CoV-2 target nucleic acids are NOT DETECTED.  The SARS-CoV-2 RNA is generally detectable in upper respiratory specimens during the acute phase of infection. The lowest concentration of SARS-CoV-2 viral copies this assay can detect is 138 copies/mL. A negative result does not preclude  SARS-Cov-2 infection and should not be used as the sole basis for treatment or other patient management decisions. A negative result may occur with  improper specimen collection/handling, submission of specimen other than nasopharyngeal swab, presence of viral mutation(s) within the areas targeted by this assay, and inadequate number of viral copies(<138 copies/mL). A negative result must be combined with clinical observations, patient history, and epidemiological information. The expected result is Negative.  Fact Sheet for Patients:  EntrepreneurPulse.com.au  Fact Sheet for Healthcare Providers:  IncredibleEmployment.be  This test is no t yet approved or cleared by the [place] and  has been authorized for detection and/or diagnosis of SARS-CoV-2 by FDA under an Emergency Use Authorization (EUA). This EUA will remain  in effect (meaning this test can be used) for the duration of the COVID-19 declaration under Section 564(b)(1) of the Act, 21 U.S.C.section 360bbb-3(b)(1), unless the authorization is terminated  or revoked sooner.       Influenza A by PCR NEGATIVE NEGATIVE Final   Influenza B by PCR NEGATIVE NEGATIVE Final    Comment: (NOTE) The Xpert Xpress SARS-CoV-2/FLU/RSV plus assay is intended as an aid in the diagnosis of influenza from Nasopharyngeal swab specimens and should not be used as a sole basis for treatment. Nasal washings and aspirates are unacceptable for Xpert Xpress SARS-CoV-2/FLU/RSV testing.  Fact Sheet for Patients: EntrepreneurPulse.com.au  Fact Sheet for Healthcare Providers: IncredibleEmployment.be  This test is not yet approved or cleared by the [place] FDA and has been authorized for detection and/or diagnosis of SARS-CoV-2 by FDA under an Emergency Use Authorization (EUA). This EUA will remain in effect (meaning this test can be used) for the duration of  the COVID-19 declaration under Section 564(b)(1) of the Act, 21 U.S.C. section 360bbb-3(b)(1), unless the authorization is terminated or revoked.  Performed at [facility], [address] [place], [place] [address]          Radiology Studies: No results found.      Scheduled Meds:  ALPRAZolam  1 mg Oral Daily   busPIRone  10 mg Oral TID   doxepin  25 mg Oral QHS   enoxaparin (LOVENOX) injection  40 mg Subcutaneous [id number]   folic acid  1 mg Oral Daily   mirtazapine  15 mg Oral QHS   sodium chloride  1 g Oral BID WC   thiamine  100 mg Oral Daily   Continuous Infusions:  0.9 % NaCl with KCl 40 mEq / L     magnesium sulfate bolus IVPB     potassium chloride       LOS: 3 days    Time spent: 27 minutes    Sharen Hones, MD [facility]   To contact the attending provider between 7A-7P or the covering provider during after hours 7P-7A, please log into the web site [email or web address] and access using universal [place] password for that web site. If you do not have the password, please call the hospital operator.  04/13/2021, 12:23 PM

## 2021-04-14 LAB — BASIC METABOLIC PANEL
Anion gap: 6 (ref 5–15)
BUN: <5 mg/dL — ABNORMAL LOW (ref 6–20)
CO2: 26 mmol/L (ref 22–32)
Calcium: 8.5 mg/dL — ABNORMAL LOW (ref 8.9–10.3)
Chloride: 99 mmol/L (ref 98–111)
Creatinine, Ser: 0.47 mg/dL — ABNORMAL LOW (ref 0.61–1.24)
GFR, Estimated: >60 mL/min (ref >60)
Glucose, Bld: 117 mg/dL — ABNORMAL HIGH (ref 70–99)
Potassium: 3.2 mmol/L — ABNORMAL LOW (ref 3.5–5.1)
Sodium: 131 mmol/L — ABNORMAL LOW (ref 135–145)

## 2021-04-14 LAB — POTASSIUM: Potassium: 4.3 mmol/L (ref 3.5–5.1)

## 2021-04-14 LAB — VITAMIN B1: Vitamin B1 (Thiamine): 257.6 nmol/L — ABNORMAL HIGH (ref 66.5–200.0)

## 2021-04-14 LAB — MAGNESIUM: Magnesium: 2 mg/dL (ref 1.7–2.4)

## 2021-04-14 MED ORDER — POTASSIUM CHLORIDE 10 MEQ/100ML IV SOLN
10.0000 meq | INTRAVENOUS | Status: AC
  Administered 2021-04-14 (×4): 10 meq via INTRAVENOUS
  Filled 2021-04-14 (×2): qty 100

## 2021-04-14 MED ORDER — ENSURE ENLIVE PO LIQD
237.0000 mL | Freq: Three times a day (TID) | ORAL | Status: DC
Start: 2021-04-14 — End: 2021-04-15
  Administered 2021-04-14 – 2021-04-15 (×3): 237 mL via ORAL

## 2021-04-14 NOTE — Progress Notes (Signed)
PROGRESS NOTE    Micheal [name]  [id number] DOB: 05/30/1985 DOA: 04/10/2021 PCP: Pcp, No   Chief complaint: seizure. Brief Narrative:  Micheal [name] is a 35 y.o. Caucasian male with medical history significant for anxiety and panic disorder, alcohol abuse and seizure disorder, who presented to the emergency room with acute onset of right hip pain after having a fall from bed a couple of days ago when he had a seizure episode. Upon arriving the hospital, his sodium was 123, potassium less than 2.  Was given fluids and also potassium.  X-ray showed nondisplaced right greater trochanter fracture.     Assessment & Plan:   Principal Problem:   Hyponatremia Active Problems:   Hypokalemia   Fall due to seizure ([place])  Hyponatremia secondary to dehydration. Severe hypokalemia secondary to dehydration and poor p.o. intake. Hypomagnesemia. Patient received 40 mEq IV potassium and continued normal saline with 40 mEq/L of potassium overnight.  Potassium level 3.1 today.  I will discontinue fluids, as patient has no longer any dehydration.  40 mEq IV potassium again.  Continue salt tablets 1 g twice a day for hyponatremia. Patient potentially can be discharged in 1 to 2 days once potassium level normalized.   Right greater trochanter fracture. Seizure disorder. Mechanical fall due to seizure. Continue Xanax 1 mg daily. Patient does not need surgery, will continue pain medicine as needed.   Alcohol use disorder. Patient has no evidence of alcohol withdrawal.   DVT prophylaxis: Lovenox Code Status: full Family Communication:  Disposition Plan:      Status is: Inpatient   Remains inpatient appropriate because: Severity of the medical conditions and IV treatment.        I/O last 3 completed shifts: In: 3710.3 [P.O.:1400; I.V.:1182.8; IV Piggyback:1127.6] Out: 1950 [Urine:1950] Total I/O In: 240 [P.O.:240] Out: -     Subjective: Patient doing well today, he has ulcer  on his tongue, he bit his tongue when he had a seizure.  That has affected his eating.  But he seems to be better today, requested Ensure 3 times a day. No confusion, no additional seizures. Denies any short of breath or cough. No abdominal pain or nausea vomiting No dysuria hematuria.  Objective: Vitals:   04/13/21 2038 04/14/21 0513 04/14/21 0746 04/14/21 1152  BP: 119/69 133/86 (!) 153/83 115/70  Pulse: 97 (!) 101 99 90  Resp: 18 18 16 18   Temp: 98.9 F (37.2 C) 99.6 F (37.6 C) 98.4 F (36.9 C) 98.6 F (37 C)  TempSrc: Oral Oral Oral Oral  SpO2: 98% 100% 100% 99%  Weight:      Height:        Intake/Output Summary (Last 24 hours) at 04/14/2021 1244 Last data filed at 04/14/2021 1044 Gross per 24 hour  Intake 2630.34 ml  Output 1350 ml  Net 1280.34 ml   Filed Weights   04/10/21 1620  Weight: 68.9 kg    Examination:  General exam: Appears calm and comfortable  Respiratory system: Clear to auscultation. Respiratory effort normal. Cardiovascular system: S1 & S2 heard, RRR. No JVD, murmurs, rubs, gallops or clicks. No pedal edema. Gastrointestinal system: Abdomen is nondistended, soft and nontender. No organomegaly or masses felt. Normal bowel sounds heard. Central nervous system: Alert and oriented. No focal neurological deficits. Extremities: Symmetric 5 x 5 power. Skin: No rashes, lesions or ulcers Psychiatry: Judgement and insight appear normal. Mood & affect appropriate.     Data Reviewed: I have personally reviewed following labs and imaging  studies  CBC: Recent Labs  Lab 04/10/21 1726 04/11/21 0706 04/12/21 0900  WBC 10.6* 7.6 6.8  NEUTROABS 8.1*  --  4.4  HGB 12.9* 11.9* 11.6*  HCT 35.8* 33.9* 33.1*  MCV 90.6 92.1 93.5  PLT 196 166 188   Basic Metabolic Panel: Recent Labs  Lab 04/11/21 0706 04/11/21 1803 04/12/21 0900 04/12/21 1554 04/13/21 0345 04/13/21 0700 04/13/21 1743 04/14/21 0503  NA 127* 129* 132*  --  132*  --   --  131*  K  2.0* 4.1 2.7* 2.8* 2.5*  --  2.9* 3.2*  CL 84* 89* 89*  --  95*  --   --  99  CO2 35* 31 35*  --  30  --   --  26  GLUCOSE 98 87 100*  --  101*  --   --  117*  BUN 9 6 <5*  --  6  --   --  <5*  CREATININE 0.57* 0.61 0.57*  --  0.56*  --   --  0.47*  CALCIUM 8.5* 8.3* 9.1  --  8.7*  --   --  8.5*  MG 2.0  --  1.8  --  1.7 1.4*  --  2.0  PHOS  --   --  3.5  --   --   --   --   --    GFR: Estimated Creatinine Clearance: 123.2 mL/min (A) (by C-G formula based on SCr of 0.47 mg/dL (L)). Liver Function Tests: Recent Labs  Lab 04/10/21 1726  AST 23  ALT 14  ALKPHOS 64  BILITOT 1.9*  PROT 8.0  ALBUMIN 4.2   No results for input(s): LIPASE, AMYLASE in the last 168 hours. No results for input(s): AMMONIA in the last 168 hours. Coagulation Profile: No results for input(s): INR, PROTIME in the last 168 hours. Cardiac Enzymes: Recent Labs  Lab 04/11/21 1804  CKTOTAL 166   BNP (last 3 results) No results for input(s): PROBNP in the last 8760 hours. HbA1C: No results for input(s): HGBA1C in the last 72 hours. CBG: No results for input(s): GLUCAP in the last 168 hours. Lipid Profile: No results for input(s): CHOL, HDL, LDLCALC, TRIG, CHOLHDL, LDLDIRECT in the last 72 hours. Thyroid Function Tests: No results for input(s): TSH, T4TOTAL, FREET4, T3FREE, THYROIDAB in the last 72 hours. Anemia Panel: No results for input(s): VITAMINB12, FOLATE, FERRITIN, TIBC, IRON, RETICCTPCT in the last 72 hours. Sepsis Labs: No results for input(s): PROCALCITON, LATICACIDVEN in the last 168 hours.  Recent Results (from the past 240 hour(s))  Resp Panel by RT-PCR (Flu A&B, Covid)     Status: None   Collection Time: 04/11/21  1:43 AM   Specimen: Nasopharyngeal(NP) swabs in vial transport medium  Result Value Ref Range Status   SARS Coronavirus 2 by RT PCR NEGATIVE NEGATIVE Final    Comment: (NOTE) SARS-CoV-2 target nucleic acids are NOT DETECTED.  The SARS-CoV-2 RNA is generally detectable in  upper respiratory specimens during the acute phase of infection. The lowest concentration of SARS-CoV-2 viral copies this assay can detect is 138 copies/mL. A negative result does not preclude SARS-Cov-2 infection and should not be used as the sole basis for treatment or other patient management decisions. A negative result may occur with  improper specimen collection/handling, submission of specimen other than nasopharyngeal swab, presence of viral mutation(s) within the areas targeted by this assay, and inadequate number of viral copies(<138 copies/mL). A negative result must be combined with clinical observations, patient history,  and epidemiological information. The expected result is Negative.  Fact Sheet for Patients:  EntrepreneurPulse.com.au  Fact Sheet for Healthcare Providers:  IncredibleEmployment.be  This test is no t yet approved or cleared by the [place] FDA and  has been authorized for detection and/or diagnosis of SARS-CoV-2 by FDA under an Emergency Use Authorization (EUA). This EUA will remain  in effect (meaning this test can be used) for the duration of the COVID-19 declaration under Section 564(b)(1) of the Act, 21 U.S.C.section 360bbb-3(b)(1), unless the authorization is terminated  or revoked sooner.       Influenza A by PCR NEGATIVE NEGATIVE Final   Influenza B by PCR NEGATIVE NEGATIVE Final    Comment: (NOTE) The Xpert Xpress SARS-CoV-2/FLU/RSV plus assay is intended as an aid in the diagnosis of influenza from Nasopharyngeal swab specimens and should not be used as a sole basis for treatment. Nasal washings and aspirates are unacceptable for Xpert Xpress SARS-CoV-2/FLU/RSV testing.  Fact Sheet for Patients: EntrepreneurPulse.com.au  Fact Sheet for Healthcare Providers: IncredibleEmployment.be  This test is not yet approved or cleared by the [place] FDA and has been  authorized for detection and/or diagnosis of SARS-CoV-2 by FDA under an Emergency Use Authorization (EUA). This EUA will remain in effect (meaning this test can be used) for the duration of the COVID-19 declaration under Section 564(b)(1) of the Act, 21 U.S.C. section 360bbb-3(b)(1), unless the authorization is terminated or revoked.  Performed at [facility], [address], [place], [place] [address]          Radiology Studies: No results found.      Scheduled Meds:  ALPRAZolam  1 mg Oral Daily   busPIRone  10 mg Oral TID   doxepin  25 mg Oral QHS   enoxaparin (LOVENOX) injection  40 mg Subcutaneous [id number]   folic acid  1 mg Oral Daily   mouth rinse  15 mL Mouth Rinse q12n4p   mirtazapine  15 mg Oral QHS   sodium chloride  1 g Oral BID WC   thiamine  100 mg Oral Daily   Continuous Infusions:  potassium chloride       LOS: 4 days    Time spent: 26 minutes    Sharen Hones, MD [facility]   To contact the attending provider between 7A-7P or the covering provider during after hours 7P-7A, please log into the web site [email or web address] and access using universal [place] password for that web site. If you do not have the password, please call the hospital operator.  04/14/2021, 12:44 PM

## 2021-04-14 NOTE — Progress Notes (Signed)
Magnesium and potassium replaced overnight. Potassium 3.2 from 2.9. Sodium 131 from 132. Magnesium 2 from 1.4. (+) bowel movement. Pain managed with prn medications.

## 2021-04-14 NOTE — TOC Initial Note (Signed)
Transition of Care [facility]) - Initial/Assessment Note    Patient Details  Name: Micheal [name] MRN: [id number] Date of Birth: Jun 14, 1985  [facility]) CM/SW Contact:    Caryn Section, RN Phone Number: 04/14/2021, 3:21 PM  Clinical Narrative:      Patient lives at home with father, who helps him, takes him to appointments and assist with medications.  Patient states he feels safe and without concern about returning home.  Patient requires taller crutches, as he is 6'4".  Charity crutches ordered through [employer] [facility] [employer], who will process this order.  Patient given PCP resources, states he will look over and determine where he would like to establish PCP.  He expects to attend ortho appointment  TOC to follow for needs.             Expected Discharge Plan: Home/Self Care Barriers to Discharge: Continued Medical Work up   Patient Goals and CMS Choice        Expected Discharge Plan and Services Expected Discharge Plan: Home/Self Care In-house Referral: PCP / [facility], Financial Counselor Discharge Planning Services: CM Consult Post Acute Care Choice: Durable Medical Equipment Living arrangements for the past 2 months: Single Family Home                 DME Arranged: Crutches DME Agency: [employer] Date DME Agency Contacted: 04/14/21 Time DME Agency Contacted: 1520 Representative spoke with at DME Agency: Oletha Cruel            Prior Living Arrangements/Services Living arrangements for the past 2 months: Single Family Home Lives with:: Self, Parents Patient language and need for interpreter reviewed:: Yes (No interpreter required) Do you feel safe going back to the place where you live?: Yes      Need for Family Participation in Patient Care: Yes (Comment) Care giver support system in place?: Yes (comment) Current home services:  (n/a) Criminal Activity/Legal Involvement Pertinent to Current Situation/Hospitalization: No - Comment as  needed  Activities of Daily Living Home Assistive Devices/Equipment: None ADL Screening (condition at time of admission) Patient's cognitive ability adequate to safely complete daily activities?: Yes Is the patient deaf or have difficulty hearing?: No Does the patient have difficulty seeing, even when wearing glasses/contacts?: No Does the patient have difficulty concentrating, remembering, or making decisions?: No Patient able to express need for assistance with ADLs?: Yes Does the patient have difficulty dressing or bathing?: No Independently performs ADLs?: Yes (appropriate for developmental age) Does the patient have difficulty walking or climbing stairs?: Yes Weakness of Legs: Right Weakness of Arms/Hands: None  Permission Sought/Granted Permission sought to share information with : Case Manager, Other (comment)       Permission granted to share info w AGENCY: DME agency [employer]        Emotional Assessment Appearance:: Appears stated age Attitude/Demeanor/Rapport: Engaged Affect (typically observed): Pleasant, Appropriate Orientation: : Oriented to Self, Oriented to Place, Oriented to  Time, Oriented to Situation Alcohol / Substance Use: Illicit Drugs    Admission diagnosis:  Hypokalemia [E87.6] Hyponatremia [E87.1] Seizure (HCC) [R56.9] Pain [R52] Closed nondisplaced fracture of greater trochanter of right femur, initial encounter (HCC) [S72.114A] Patient Active Problem List   Diagnosis Date Noted   Fall due to seizure (HCC) 04/11/2021   Moderate episode of recurrent major depressive disorder (HCC) 07/29/2020   Alcohol use disorder, mild, abuse 06/25/2020   Panic disorder (episodic paroxysmal anxiety) 12/14/2019   Hyponatremia    Hypokalemia    Polysubstance  abuse (HCC)    Alcohol withdrawal (HCC) 11/13/2019   PCP:  Oneita Hurt No Pharmacy:   [facility] [address] ([address]), [place] - [address] [place]) [place]  [address] Phone: [phone] Fax: [phone]     Social Determinants of Health (SDOH) Interventions    Readmission Risk Interventions No flowsheet data found.

## 2021-04-15 ENCOUNTER — Other Ambulatory Visit: Payer: Self-pay

## 2021-04-15 ENCOUNTER — Other Ambulatory Visit (HOSPITAL_COMMUNITY): Payer: Self-pay | Admitting: Psychiatry

## 2021-04-15 DIAGNOSIS — F410 Panic disorder [episodic paroxysmal anxiety] without agoraphobia: Secondary | ICD-10-CM

## 2021-04-15 LAB — BASIC METABOLIC PANEL
Anion gap: 7 (ref 5–15)
BUN: 9 mg/dL (ref 6–20)
CO2: 27 mmol/L (ref 22–32)
Calcium: 8.8 mg/dL — ABNORMAL LOW (ref 8.9–10.3)
Chloride: 99 mmol/L (ref 98–111)
Creatinine, Ser: 0.57 mg/dL — ABNORMAL LOW (ref 0.61–1.24)
GFR, Estimated: >60 mL/min (ref >60)
Glucose, Bld: 113 mg/dL — ABNORMAL HIGH (ref 70–99)
Potassium: 3.8 mmol/L (ref 3.5–5.1)
Sodium: 133 mmol/L — ABNORMAL LOW (ref 135–145)

## 2021-04-15 LAB — CBC
HCT: 32.9 % — ABNORMAL LOW (ref 39.0–52.0)
Hemoglobin: 11.1 g/dL — ABNORMAL LOW (ref 13.0–17.0)
MCH: 32.5 pg (ref 26.0–34.0)
MCHC: 33.7 g/dL (ref 30.0–36.0)
MCV: 96.2 fL (ref 80.0–100.0)
Platelets: 317 10*3/uL (ref 150–400)
RBC: 3.42 MIL/uL — ABNORMAL LOW (ref 4.22–5.81)
RDW: 13.2 % (ref 11.5–15.5)
WBC: 8.7 10*3/uL (ref 4.0–10.5)
nRBC: 0 % (ref 0.0–0.2)

## 2021-04-15 LAB — MAGNESIUM: Magnesium: 1.8 mg/dL (ref 1.7–2.4)

## 2021-04-15 MED ORDER — ALPRAZOLAM 1 MG PO TABS: 1.0000 mg | ORAL_TABLET | Freq: Every day | ORAL | 0 refills | Status: AC

## 2021-04-15 MED ORDER — ENSURE ENLIVE PO LIQD: 237.0000 mL | Freq: Three times a day (TID) | ORAL | 12 refills | Status: AC

## 2021-04-15 MED ORDER — POTASSIUM CHLORIDE CRYS ER 20 MEQ PO TBCR
20.0000 meq | EXTENDED_RELEASE_TABLET | Freq: Every day | ORAL | 0 refills | Status: AC
  Filled 2021-04-15: qty 7, 7d supply, fill #0

## 2021-04-15 NOTE — Progress Notes (Signed)
Provided presence, support and prayer for this patient who is waiting for discharge.

## 2021-04-15 NOTE — Discharge Summary (Signed)
Physician Discharge Summary   DAKWAN PRIDGEN  male DOB: June 14, 1985  JSH:[id number]  PCP: Pcp, No  Admit date: 04/10/2021 Discharge date: 04/15/2021  Admitted From: home Disposition:  home CODE STATUS: Full code  Discharge Instructions     Discharge instructions   Complete by: As directed    You have a hairline fracture from right hip.  Orthopedics recommended protected weightbearing with crutches. Return to orthopedics clinic in 2 weeks for exam and x-rays.  Your potassium level has been low.  You are discharged with 7 days of oral potassium supplement.  Please follow up with outpatient clinic ([facility]) to check potassium level.     Dr. Darlin Priestly [facility] Course:  For full details, please see H&P, progress notes, consult notes and ancillary notes.  Briefly,  Micheal [name] is a 35 y.o. Caucasian male with medical history significant for anxiety and panic disorder, alcohol abuse and seizure disorder, who presented to the emergency room with acute onset of right hip pain after having a fall from bed a couple of days ago when he reportedly had a seizure episode.  Upon arriving the hospital, his sodium was 123, potassium less than 2.  Was given fluids and also potassium.  X-ray showed nondisplaced right greater trochanter fracture.  Hyponatremia, improved Severe hypokalemia, resolved Hypomagnesemia, resolved --Na improved with IVF, to 133 prior to discharge. --Hypokalemia likely due to poor nutritional status from alcohol abuse.  Pt needed aggressive potassium supplement, and potassium level finally normalized 1 day prior to discharge.  Pt is discharged on 7 days of potassium suppl and advised to follow up [facility] to check level.   Right greater trochanter hairline fracture --reportedly from mech fall. --Ortho recommended non-surgical management. --protected weightbearing with crutches. --Return to orthopedics clinic in 2 weeks for exam  and x-rays.  Seizure disorder --Pt reported that he had a seizure episode because he didn't take his daily Xanax (he lost his Rx). --Rx for 7 days of Xanax provided at discharge so pt has time to see his outpatient prescriber.    Alcohol use disorder. Patient has no evidence of alcohol withdrawal.   Discharge Diagnoses:  Principal Problem:   Hyponatremia Active Problems:   Hypokalemia   Fall due to seizure [facility])   30 Day Unplanned Readmission Risk Score    Flowsheet Row ED to Hosp-Admission (Current) from 04/10/2021 in [facility] (1C)  30 Day Unplanned Readmission Risk Score (%) 13.46 Filed at 04/15/2021 1200       This score is the patient's risk of an unplanned readmission within 30 days of being discharged (0 -100%). The score is based on dignosis, age, lab data, medications, orders, and past utilization.   Low:  0-14.9   Medium: 15-21.9   High: 22-29.9   Extreme: 30 and above         Discharge Instructions:  Allergies as of 04/15/2021   No Known Allergies      Medication List     TAKE these medications    acetaminophen 325 MG tablet Commonly known as: TYLENOL Take 650 mg by mouth every 6 (six) hours as needed.   ALPRAZolam 1 MG tablet Commonly known as: XANAX TAKE 1 TABLET BY MOUTH ONCE DAILY AS NEEDED   busPIRone 10 MG tablet Commonly known as: BUSPAR TAKE 1 TABLET BY MOUTH THREE TIMES DAILY   doxepin 25 MG capsule Commonly known as: SINEQUAN  Take 1 capsule (25 mg total) by mouth at bedtime.   feeding supplement Liqd Take 237 mLs by mouth 3 (three) times daily between meals.   folic acid 1 MG tablet Commonly known as: FOLVITE Take 1 tablet (1 mg total) by mouth daily.   mirtazapine 15 MG tablet Commonly known as: REMERON Take 15 mg by mouth at bedtime.   potassium chloride SA 20 MEQ tablet Commonly known as: KLOR-CON Take 1 tablet (20 mEq total) by mouth daily for 7 days.   thiamine 100 MG tablet Take 1  tablet (100 mg total) by mouth daily.               Durable Medical Equipment  (From admission, onward)           Start     Ordered   04/14/21 1518  For home use only DME Crutches  Once        04/14/21 1518   04/12/21 0954  For home use only DME Crutches  Once        04/12/21 9678             Follow-up Information     Deeann Saint, MD Follow up in 2 week(s).   Specialty: Orthopedic Surgery Why: Return to clinic in 2 weeks for exam and x-rays. Contact information: [address] [place] [address] [phone]                 No Known Allergies   The results of significant diagnostics from this hospitalization (including imaging, microbiology, ancillary and laboratory) are listed below for reference.   Consultations:   Procedures/Studies: DG Lumbar Spine 2-3 Views  Result Date: 04/10/2021 CLINICAL DATA:  Status post fall. EXAM: LUMBAR SPINE - 2-3 VIEW COMPARISON:  None. FINDINGS: There is no evidence of lumbar spine fracture. Alignment is normal. Intervertebral disc spaces are maintained. IMPRESSION: Negative. Electronically Signed   By: Aram Candela M.D.   On: 04/10/2021 17:18   DG Tibia/Fibula Right  Result Date: 04/10/2021 CLINICAL DATA:  Status post fall. EXAM: RIGHT TIBIA AND FIBULA - 2 VIEW COMPARISON:  None. FINDINGS: There is no evidence of fracture or other focal bone lesions. Soft tissues are unremarkable. IMPRESSION: Negative. Electronically Signed   By: Aram Candela M.D.   On: 04/10/2021 17:15   CT HEAD WO CONTRAST ( )  Result Date: 04/10/2021 CLINICAL DATA:  Pt comes into the ED via POV with his dad c/o right leg pain. Pt had a seizure 2 days ago and he fell backwards. EXAM: CT HEAD WITHOUT CONTRAST CT CERVICAL SPINE WITHOUT CONTRAST TECHNIQUE: Multidetector CT imaging of the head and cervical spine was performed following the standard protocol without intravenous contrast. Multiplanar CT image reconstructions  of the cervical spine were also generated. COMPARISON:  CT head 04/29/2005 report without imaging FINDINGS: CT HEAD FINDINGS Brain: No evidence of large-territorial acute infarction. No parenchymal hemorrhage. No mass lesion. No extra-axial collection. No mass effect or midline shift. No hydrocephalus. Basilar cisterns are patent. Vascular: No hyperdense vessel. Skull: No acute fracture or focal lesion. Sinuses/Orbits: Paranasal sinuses and mastoid air cells are clear. The orbits are unremarkable. Other: None. CT CERVICAL SPINE FINDINGS Alignment: Straightening of the cervical lordosis. Skull base and vertebrae: No acute fracture. No aggressive appearing focal osseous lesion or focal pathologic process. Soft tissues and spinal canal: No prevertebral fluid or swelling. No visible canal hematoma. Upper chest: Severe biapical pleural/pulmonary scarring. Other: None. IMPRESSION: 1. No acute intracranial abnormality. 2. No  acute displaced fracture or traumatic listhesis of the cervical spine. Electronically Signed   By: Tish Frederickson M.D.   On: 04/10/2021 18:33   CT Cervical Spine Wo Contrast  Result Date: 04/10/2021 CLINICAL DATA:  Pt comes into the ED via POV with his dad c/o right leg pain. Pt had a seizure 2 days ago and he fell backwards. EXAM: CT HEAD WITHOUT CONTRAST CT CERVICAL SPINE WITHOUT CONTRAST TECHNIQUE: Multidetector CT imaging of the head and cervical spine was performed following the standard protocol without intravenous contrast. Multiplanar CT image reconstructions of the cervical spine were also generated. COMPARISON:  CT head 04/29/2005 report without imaging FINDINGS: CT HEAD FINDINGS Brain: No evidence of large-territorial acute infarction. No parenchymal hemorrhage. No mass lesion. No extra-axial collection. No mass effect or midline shift. No hydrocephalus. Basilar cisterns are patent. Vascular: No hyperdense vessel. Skull: No acute fracture or focal lesion. Sinuses/Orbits: Paranasal  sinuses and mastoid air cells are clear. The orbits are unremarkable. Other: None. CT CERVICAL SPINE FINDINGS Alignment: Straightening of the cervical lordosis. Skull base and vertebrae: No acute fracture. No aggressive appearing focal osseous lesion or focal pathologic process. Soft tissues and spinal canal: No prevertebral fluid or swelling. No visible canal hematoma. Upper chest: Severe biapical pleural/pulmonary scarring. Other: None. IMPRESSION: 1. No acute intracranial abnormality. 2. No acute displaced fracture or traumatic listhesis of the cervical spine. Electronically Signed   By: Tish Frederickson M.D.   On: 04/10/2021 18:33   DG Foot Complete Right  Result Date: 04/10/2021 CLINICAL DATA:  Status post fall. EXAM: RIGHT FOOT COMPLETE - 3+ VIEW COMPARISON:  None. FINDINGS: There is no evidence of fracture or dislocation. There is no evidence of arthropathy or other focal bone abnormality. Soft tissues are unremarkable. IMPRESSION: Negative. Electronically Signed   By: Aram Candela M.D.   On: 04/10/2021 17:16   DG FEMUR, MIN 2 VIEWS RIGHT  Result Date: 04/10/2021 CLINICAL DATA:  Status post fall. EXAM: RIGHT FEMUR 2 VIEWS COMPARISON:  None. FINDINGS: An acute, nondisplaced fracture is seen involving the greater trochanter of the proximal right femur. There is no evidence of dislocation. Soft tissues are unremarkable. IMPRESSION: Acute nondisplaced fracture of the greater trochanter of the proximal right femur. Electronically Signed   By: Aram Candela M.D.   On: 04/10/2021 17:17      Labs: BNP (last 3 results) No results for input(s): BNP in the last 8760 hours. Basic Metabolic Panel: Recent Labs  Lab 04/11/21 1803 04/12/21 0900 04/12/21 1554 04/13/21 0345 04/13/21 0700 04/13/21 1743 04/14/21 0503 04/14/21 1700 04/15/21 0427  NA 129* 132*  --  132*  --   --  131*  --  133*  K 4.1 2.7*   < > 2.5*  --  2.9* 3.2* 4.3 3.8  CL 89* 89*  --  95*  --   --  99  --  99  CO2 31  35*  --  30  --   --  26  --  27  GLUCOSE 87 100*  --  101*  --   --  117*  --  113*  BUN 6 <5*  --  6  --   --  <5*  --  9  CREATININE 0.61 0.57*  --  0.56*  --   --  0.47*  --  0.57*  CALCIUM 8.3* 9.1  --  8.7*  --   --  8.5*  --  8.8*  MG  --  1.8  --  1.7 1.4*  --  2.0  --  1.8  PHOS  --  3.5  --   --   --   --   --   --   --    < > = values in this interval not displayed.   Liver Function Tests: Recent Labs  Lab 04/10/21 1726  AST 23  ALT 14  ALKPHOS 64  BILITOT 1.9*  PROT 8.0  ALBUMIN 4.2   No results for input(s): LIPASE, AMYLASE in the last 168 hours. No results for input(s): AMMONIA in the last 168 hours. CBC: Recent Labs  Lab 04/10/21 1726 04/11/21 0706 04/12/21 0900 04/15/21 0427  WBC 10.6* 7.6 6.8 8.7  NEUTROABS 8.1*  --  4.4  --   HGB 12.9* 11.9* 11.6* 11.1*  HCT 35.8* 33.9* 33.1* 32.9*  MCV 90.6 92.1 93.5 96.2  PLT 196 166 188 317   Cardiac Enzymes: Recent Labs  Lab 04/11/21 1804  CKTOTAL 166   BNP: Invalid input(s): POCBNP CBG: No results for input(s): GLUCAP in the last 168 hours. D-Dimer No results for input(s): DDIMER in the last 72 hours. Hgb A1c No results for input(s): HGBA1C in the last 72 hours. Lipid Profile No results for input(s): CHOL, HDL, LDLCALC, TRIG, CHOLHDL, LDLDIRECT in the last 72 hours. Thyroid function studies No results for input(s): TSH, T4TOTAL, T3FREE, THYROIDAB in the last 72 hours.  Invalid input(s): FREET3 Anemia work up No results for input(s): VITAMINB12, FOLATE, FERRITIN, TIBC, IRON, RETICCTPCT in the last 72 hours. Urinalysis    Component Value Date/Time   COLORURINE STRAW (A) 04/11/2021 0617   APPEARANCEUR CLEAR (A) 04/11/2021 0617   APPEARANCEUR Clear 11/19/2013 1522   LABSPEC 1.003 (L) 04/11/2021 0617   LABSPEC 1.014 11/19/2013 1522   PHURINE 7.0 04/11/2021 0617   GLUCOSEU NEGATIVE 04/11/2021 0617   GLUCOSEU Negative 11/19/2013 1522   HGBUR NEGATIVE 04/11/2021 0617   BILIRUBINUR NEGATIVE  04/11/2021 0617   BILIRUBINUR Negative 11/19/2013 1522   KETONESUR NEGATIVE 04/11/2021 0617   PROTEINUR NEGATIVE 04/11/2021 0617   NITRITE NEGATIVE 04/11/2021 0617   LEUKOCYTESUR NEGATIVE 04/11/2021 0617   LEUKOCYTESUR Negative 11/19/2013 1522   Sepsis Labs Invalid input(s): PROCALCITONIN,  WBC,  LACTICIDVEN Microbiology Recent Results (from the past 240 hour(s))  Resp Panel by RT-PCR (Flu A&B, Covid)     Status: None   Collection Time: 04/11/21  1:43 AM   Specimen: Nasopharyngeal(NP) swabs in vial transport medium  Result Value Ref Range Status   SARS Coronavirus 2 by RT PCR NEGATIVE NEGATIVE Final    Comment: (NOTE) SARS-CoV-2 target nucleic acids are NOT DETECTED.  The SARS-CoV-2 RNA is generally detectable in upper respiratory specimens during the acute phase of infection. The lowest concentration of SARS-CoV-2 viral copies this assay can detect is 138 copies/mL. A negative result does not preclude SARS-Cov-2 infection and should not be used as the sole basis for treatment or other patient management decisions. A negative result may occur with  improper specimen collection/handling, submission of specimen other than nasopharyngeal swab, presence of viral mutation(s) within the areas targeted by this assay, and inadequate number of viral copies(<138 copies/mL). A negative result must be combined with clinical observations, patient history, and epidemiological information. The expected result is Negative.  Fact Sheet for Patients:  BloggerCourse.com  Fact Sheet for Healthcare Providers:  SeriousBroker.it  This test is no t yet approved or cleared by the [place] FDA and  has been authorized for detection and/or diagnosis of SARS-CoV-2 by FDA under  an Emergency Use Authorization (EUA). This EUA will remain  in effect (meaning this test can be used) for the duration of the COVID-19 declaration under Section 564(b)(1) of  the Act, 21 U.S.C.section 360bbb-3(b)(1), unless the authorization is terminated  or revoked sooner.       Influenza A by PCR NEGATIVE NEGATIVE Final   Influenza B by PCR NEGATIVE NEGATIVE Final    Comment: (NOTE) The Xpert Xpress SARS-CoV-2/FLU/RSV plus assay is intended as an aid in the diagnosis of influenza from Nasopharyngeal swab specimens and should not be used as a sole basis for treatment. Nasal washings and aspirates are unacceptable for Xpert Xpress SARS-CoV-2/FLU/RSV testing.  Fact Sheet for Patients: BloggerCourse.com  Fact Sheet for Healthcare Providers: SeriousBroker.it  This test is not yet approved or cleared by the [place] FDA and has been authorized for detection and/or diagnosis of SARS-CoV-2 by FDA under an Emergency Use Authorization (EUA). This EUA will remain in effect (meaning this test can be used) for the duration of the COVID-19 declaration under Section 564(b)(1) of the Act, 21 U.S.C. section 360bbb-3(b)(1), unless the authorization is terminated or revoked.  Performed at [facility], [address] [place], [place] [address]      Total time spend on discharging this patient, including the last patient exam, discussing the hospital stay, instructions for ongoing care as it relates to all pertinent caregivers, as well as preparing the medical discharge records, prescriptions, and/or referrals as applicable, is 45 minutes.    Darlin Priestly, MD  [facility] 04/15/2021, 12:22 PM

## 2021-04-20 ENCOUNTER — Telehealth (HOSPITAL_COMMUNITY): Payer: Self-pay | Admitting: Psychiatry

## 2021-04-20 NOTE — Telephone Encounter (Signed)
Medication refilled and sent to preferred pharmacy

## 2021-04-20 NOTE — Telephone Encounter (Signed)
Patient called for REFILL:  ALPRAZOLAM 1 MG Pharmacy :  [facility].  Patient phone number: [phone] Last seen:  01/21/21 COMMENTS:   Written 04/16/21 7 days supply, [facility] D/C Available to refill 04/23/21

## 2021-04-29 ENCOUNTER — Ambulatory Visit (INDEPENDENT_AMBULATORY_CARE_PROVIDER_SITE_OTHER): Payer: Self-pay | Admitting: Psychiatry

## 2021-04-29 ENCOUNTER — Encounter (HOSPITAL_COMMUNITY): Payer: Self-pay | Admitting: Psychiatry

## 2021-04-29 ENCOUNTER — Other Ambulatory Visit: Payer: Self-pay

## 2021-04-29 DIAGNOSIS — F410 Panic disorder [episodic paroxysmal anxiety] without agoraphobia: Secondary | ICD-10-CM

## 2021-04-29 MED ORDER — DOXEPIN HCL 25 MG PO CAPS: 25.0000 mg | ORAL_CAPSULE | Freq: Every day | ORAL | 3 refills | Status: AC

## 2021-04-29 MED ORDER — BUSPIRONE HCL 10 MG PO TABS: 10.0000 mg | ORAL_TABLET | Freq: Three times a day (TID) | ORAL | 3 refills | Status: AC

## 2021-04-29 NOTE — Progress Notes (Signed)
BH MD/PA/NP OP Progress Note Virtual Visit via Telephone Note  I connected with Micheal [name] on 04/29/21 at  9:30 AM EST by telephone and verified that I am speaking with the correct person using two identifiers.  Location: Patient: home Provider: Clinic   I discussed the limitations, risks, security and privacy concerns of performing an evaluation and management service by telephone and the availability of in person appointments. I also discussed with the patient that there may be a patient responsible charge related to this service. The patient expressed understanding and agreed to proceed.   I provided 30 minutes of non-face-to-face time during this encounter.   04/29/2021 10:34 AM Micheal [name]  MRN:  [id number]  Chief Complaint: " I feel pretty decent" Chief Complaint   Medication Management     HPI:   35 year old male seen today for follow up psychiatric evaluation. He has a psychiatric history of panic disorder and polysubstance use. He is currently being managed on Xanax 0.5 mg twice daily. Patient noted that his medications are effective in managing his psychiatric conditions.   Today patient was well groomed, pleasant, engaged in conversation, cooperative, and engaged in eye contact.  Patient observed walking on crutches.  He notes that recently he fractured his hip after falling in his room.  He informed Clinical research associate that since his discharge his pain has been well managed.  He notes that he takes Tylenol to help manage breakthrough pain.  Patient also informed writer that his anxiety and depression has improved.  He was prescribed mirtazapine and BuSpar in the hospital however he notes that he has discontinued them.  Today provider conducted a GAD-7 and patient scored an 8, at his last visit he scored a 13.  Provider also conducted PHQ-9 and patient scored a 3, at his last visit he scored a 10.  Patient informed Clinical research associate that the source of his stress is his brother and his father.   He notes that they have been arguing recently.  He informed Clinical research associate that he relapsed on alcohol once or twice last month but notes he has not touched an alcoholic beverage since.  Provider gave patient references to substance use facilities in the area.  Today he denies SI/HI/VAH, mania, or paranoia.   No medication changes made today.  Patient agreeable to continue medications as prescribed.  He will follow-up with outpatient counseling for therapy.  Xanax not refilled today as it was recently refilled.  No other concerns at this time.    Visit Diagnosis:    ICD-10-CM   1. Panic disorder (episodic paroxysmal anxiety)  F41.0 doxepin (SINEQUAN) 25 MG capsule    busPIRone (BUSPAR) 10 MG tablet      Past Psychiatric History: panic disorder and polysubstance use  Past Medical History:  Past Medical History:  Diagnosis Date   Anxiety    ETOH abuse    Panic disorder    Seizures (HCC)    No past surgical history on file.  Family Psychiatric History: Denies  Family History: No family history on file.  Social History:  Social History   Socioeconomic History   Marital status: Single    Spouse name: Not on file   Number of children: Not on file   Years of education: Not on file   Highest education level: Not on file  Occupational History   Not on file  Tobacco Use   Smoking status: Some Days    Types: Cigarettes   Smokeless tobacco: Never  Substance and  Sexual Activity   Alcohol use: Yes    Comment: intermittent binge drinks, hx of etoh withdraw with seizures   Drug use: No   Sexual activity: Not on file  Other Topics Concern   Not on file  Social History Narrative   Not on file   Social Determinants of Health   Financial Resource Strain: Not on file  Food Insecurity: Not on file  Transportation Needs: Not on file  Physical Activity: Not on file  Stress: Not on file  Social Connections: Not on file    Allergies: No Known Allergies  Metabolic Disorder Labs: No  results found for: HGBA1C, MPG No results found for: PROLACTIN No results found for: CHOL, TRIG, HDL, CHOLHDL, VLDL, LDLCALC Lab Results  Component Value Date   TSH 4.299 04/11/2021   TSH 4.130 06/17/2016    Therapeutic Level Labs: No results found for: LITHIUM Lab Results  Component Value Date   VALPROATE 98 12/28/2011   No components found for:  CBMZ  Current Medications: Current Outpatient Medications  Medication Sig Dispense Refill   acetaminophen (TYLENOL) 325 MG tablet Take 650 mg by mouth every 6 (six) hours as needed.     ALPRAZolam (XANAX) 1 MG tablet TAKE 1 TABLET BY MOUTH ONCE DAILY AS NEEDED 30 tablet 0   feeding supplement (ENSURE ENLIVE / ENSURE PLUS) LIQD Take 237 mLs by mouth 3 (three) times daily between meals. 237 mL 12   folic acid (FOLVITE) 1 MG tablet Take 1 tablet (1 mg total) by mouth daily. 90 tablet 0   thiamine 100 MG tablet Take 1 tablet (100 mg total) by mouth daily. 90 tablet 0   busPIRone (BUSPAR) 10 MG tablet Take 1 tablet (10 mg total) by mouth 3 (three) times daily. 90 tablet 3   doxepin (SINEQUAN) 25 MG capsule Take 1 capsule (25 mg total) by mouth at bedtime. 30 capsule 3   potassium chloride SA (KLOR-CON) 20 MEQ tablet Take 1 tablet (20 mEq total) by mouth daily for 7 days. 7 tablet 0   No current facility-administered medications for this visit.     Musculoskeletal: Strength & Muscle Tone: decreased and patient walking with crutches Gait & Station: unsteady, patient walking with crutches Patient leans: N/A  Psychiatric Specialty Exam: Review of Systems  Blood pressure 118/77, pulse 91, height 6\' 4"  (1.93 m), weight 143 lb (64.9 kg).Body mass index is 17.41 kg/m.  General Appearance: Well Groomed  Eye Contact:  Good  Speech:  Clear and Coherent and Normal Rate  Volume:  Normal  Mood:  Anxious  Affect:  Congruent  Thought Process:  Coherent, Goal Directed and Linear  Orientation:  Full (Time, Place, and Person)  Thought Content: WDL  and Logical   Suicidal Thoughts:  No  Homicidal Thoughts:  No  Memory:  Immediate;   Good Recent;   Good Remote;   Good  Judgement:  Good  Insight:  Good  Psychomotor Activity:  Normal  Concentration:  Concentration: Good and Attention Span: Good  Recall:  Good  Fund of Knowledge: Good  Language: Good  Akathisia:  No  Handed:  Right  AIMS (if indicated): Not done  Assets:  Communication Skills Desire for Improvement Financial Resources/Insurance Housing Social Support  ADL's:  Intact  Cognition: WNL  Sleep:  Good   Screenings: GAD-7    Flowsheet Row Clinical Support from 04/29/2021 in [facility] Video Visit from 01/21/2021 in [facility] Video Visit from 10/22/2020 in  [facility] Clinical Support from 07/30/2020 in [facility] Counselor from 06/24/2020 in [facility]  Total GAD-7 Score 8 13 7 16 16       PHQ2-9    Flowsheet Row Clinical Support from 04/29/2021 in [facility] Video Visit from 01/21/2021 in [facility] Video Visit from 10/22/2020 in [facility] Clinical Support from 07/30/2020 in [facility] Clinical Support from 04/30/2020 in [facility]  PHQ-2 Total Score 0 3 0 4 2  PHQ-9 Total Score 3 10 2 10 7       Flowsheet Row Clinical Support from 04/29/2021 in [facility] ED to Hosp-Admission (Discharged) from 04/10/2021 in [facility] (1C) Clinical Support from 07/30/2020 in [facility]  C-SSRS RISK CATEGORY No Risk No Risk No Risk        Assessment and Plan: Patient notes that he is doing well on his current medication regimen.  Patient was restarted on mirtazapine and BuSpar while hospitalized however notes  that he really has not been taking them.  Today he will continue BuSpar however discontinue mirtazapine.  He will continue doxepin and Xanax as prescribed.  Xanax not filled today as it was recently filled  1. Panic disorder (episodic paroxysmal anxiety)  Continue- doxepin (SINEQUAN) 25 MG capsule; Take 1 capsule (25 mg total) by mouth at bedtime.  Dispense: 30 capsule; Refill: 3 Continue- busPIRone (BUSPAR) 10 MG tablet; Take 1 tablet (10 mg total) by mouth 3 (three) times daily.  Dispense: 90 tablet; Refill: 3      Follow up in three months.  Follow-up with therapy  09/29/2020, NP 04/29/2021, 10:34 AM

## 2021-05-11 ENCOUNTER — Other Ambulatory Visit (HOSPITAL_COMMUNITY): Payer: Self-pay | Admitting: Psychiatry

## 2021-05-11 DIAGNOSIS — F410 Panic disorder [episodic paroxysmal anxiety] without agoraphobia: Secondary | ICD-10-CM

## 2021-06-12 ENCOUNTER — Other Ambulatory Visit (HOSPITAL_COMMUNITY): Payer: Self-pay | Admitting: Psychiatry

## 2021-06-12 DIAGNOSIS — F410 Panic disorder [episodic paroxysmal anxiety] without agoraphobia: Secondary | ICD-10-CM

## 2021-06-15 ENCOUNTER — Telehealth (HOSPITAL_COMMUNITY): Payer: Self-pay

## 2021-06-15 NOTE — Telephone Encounter (Signed)
Medication refilled and sent to preferred pharmacy

## 2021-06-15 NOTE — Telephone Encounter (Signed)
Received fax from [facility] on [address] in [place] requesting a refill on patient's Alprazolam 1mg . Patient has followup appointment scheduled for 07/22/21. Please review and advise. Thank you

## 2021-06-16 NOTE — Telephone Encounter (Signed)
NOTFIED PATIENT

## 2021-06-29 ENCOUNTER — Other Ambulatory Visit: Payer: Self-pay

## 2021-07-13 ENCOUNTER — Other Ambulatory Visit (HOSPITAL_COMMUNITY): Payer: Self-pay | Admitting: Psychiatry

## 2021-07-13 DIAGNOSIS — F410 Panic disorder [episodic paroxysmal anxiety] without agoraphobia: Secondary | ICD-10-CM

## 2021-07-22 ENCOUNTER — Telehealth (HOSPITAL_COMMUNITY): Payer: Self-pay | Admitting: Psychiatry

## 2021-08-05 ENCOUNTER — Other Ambulatory Visit: Payer: Self-pay

## 2021-08-06 ENCOUNTER — Other Ambulatory Visit (HOSPITAL_COMMUNITY): Payer: Self-pay | Admitting: Psychiatry

## 2021-08-06 ENCOUNTER — Telehealth (HOSPITAL_COMMUNITY): Payer: Self-pay | Admitting: *Deleted

## 2021-08-06 DIAGNOSIS — F410 Panic disorder [episodic paroxysmal anxiety] without agoraphobia: Secondary | ICD-10-CM

## 2021-08-06 NOTE — Telephone Encounter (Signed)
Pharmacy fax requesting rx for alprazolam. Patient has not been seen since 04/29/21 and missed his most recently appt. He does not have a scheduled appt for the future. I will not pursue a rx for him, he will need to make an appt or come in as a walk in before new rx will be written. I asked the front desk to call him to schedule.  ?

## 2021-08-06 NOTE — Telephone Encounter (Signed)
Pt lives in [place]. Patient did not miss last appt, his provider left on FMLA and his appt had to be canceled. LAST APPT 04/29/21. Request bridge of Lorazepam until he can see a new provider in [place].

## 2021-08-07 MED ORDER — ALPRAZOLAM 1 MG PO TABS: 1.0000 mg | ORAL_TABLET | Freq: Every day | ORAL | 0 refills | Status: AC | PRN

## 2021-08-07 NOTE — Telephone Encounter (Signed)
A 30-day bridge prescription of alprazolam 1 mg to take as needed is given.  Patient need to schedule appointment with a provider. ?

## 2021-08-07 NOTE — Addendum Note (Signed)
Addended by: Kathryne Sharper T on: 08/07/2021 08:40 AM ? ? Modules accepted: Orders ? ?

## 2021-08-11 ENCOUNTER — Telehealth (HOSPITAL_COMMUNITY): Payer: Self-pay | Admitting: Psychiatry

## 2021-09-07 ENCOUNTER — Encounter (HOSPITAL_COMMUNITY): Payer: Self-pay | Admitting: Psychiatry

## 2021-09-08 ENCOUNTER — Telehealth: Payer: Self-pay | Admitting: Pharmacist

## 2021-09-08 NOTE — Telephone Encounter (Signed)
Patient failed to provide requested 2022 financial documentation. No additional medication assistance will be provided by [facility] without the required proof of income documentation. Patient notified by letter. Debra Cheek Administrative Assistant [facility] 

## 2021-09-14 ENCOUNTER — Other Ambulatory Visit: Payer: Self-pay

## 2021-09-14 ENCOUNTER — Other Ambulatory Visit (HOSPITAL_COMMUNITY): Payer: Self-pay | Admitting: Psychiatry

## 2021-09-14 ENCOUNTER — Telehealth (HOSPITAL_COMMUNITY): Payer: Self-pay | Admitting: Psychiatry

## 2021-09-14 DIAGNOSIS — F410 Panic disorder [episodic paroxysmal anxiety] without agoraphobia: Secondary | ICD-10-CM

## 2021-09-14 NOTE — Telephone Encounter (Signed)
Agree with Dr.Kumar. ?

## 2021-09-14 NOTE — Telephone Encounter (Signed)
Patient called requesting refill on all medications. Patient is scheduled for an appointment with Dr. Shea Evans 5/23. Patient states he can be called to scheduled sooner appt if necessary.  ?

## 2021-09-14 NOTE — Telephone Encounter (Signed)
No refill done, patient was given bridge prescription of Xanax and has not followed up. Needs to do walkin at [facility] ?

## 2021-09-15 NOTE — Telephone Encounter (Signed)
Patient contacted office again, upset that he is being "dropped". Patient was informed that he would be transitioned from our services because his county of residence and being uninsured. Patient was provided the Warden/ranger received from Dr. Lucianne Muss. "You are living in [place]. And are uninsured. I was informed that you have not provided the financial information to receive a discounted rate for your services, but you can go to [facility] for bh services as a walk-in". Patient responded "they treat me like a crackhead there". Patient was not agreeable to transition and stated that he was going through withdrawal due to not having his xanax. Writer informed that he should present to UC or ED. Patient was upset and ended the call.  ?

## 2021-09-16 ENCOUNTER — Telehealth: Payer: Self-pay | Admitting: Pharmacy Technician

## 2021-09-16 NOTE — Telephone Encounter (Signed)
Patient called asking for assistance with Xanax.  Told patient that [facility] could not provide medication assistance for Xanax because it is a controlled substance.  Asked patient if there were additional medications that he needs assistance with obtaining.  Patient stated that he also takes buspirone and doxepin but did not need assistance with those medications.  Patient is call [facility] when he needs assistance with the buspirone and doxepin. ? ?Sherilyn Dacosta ?Care Manager ?[facility] ?

## 2021-09-21 DEATH — deceased

## 2021-10-13 ENCOUNTER — Telehealth: Payer: Self-pay | Admitting: Psychiatry

## 2023-08-14 IMAGING — CT CT CERVICAL SPINE W/O CM
3 of 4 series · 12 of 33 positions shown, 14 images · non-contrast
Comparison: CT head 04/29/2005 report without imaging

CLINICAL DATA: Pt comes into the ED via POV with his dad c/o right
leg pain. Pt had a seizure 2 days ago and he fell backwards.

EXAM:
CT HEAD WITHOUT CONTRAST
CT CERVICAL SPINE WITHOUT CONTRAST
TECHNIQUE: Multidetector CT imaging of the head and cervical spine was
performed following the standard protocol without intravenous
contrast. Multiplanar CT image reconstructions of the cervical spine
were also generated.

[Series 4: sagittal bone · sagittal · 0.28mm/px · 5 of 55 slices shown, 6 images]
[im 19/55  bone]
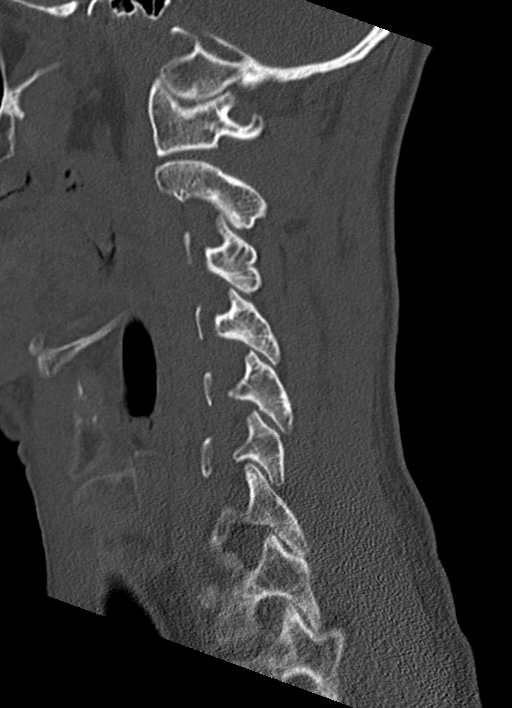
[im 23/55  bone]
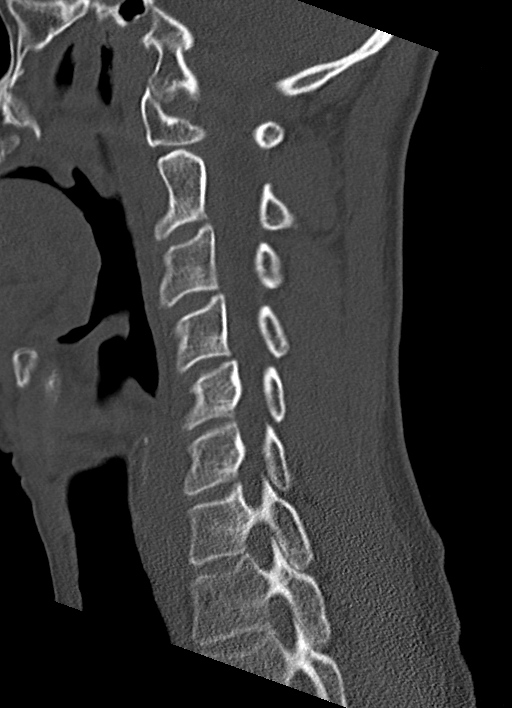
[im 28/55  soft-tissue]
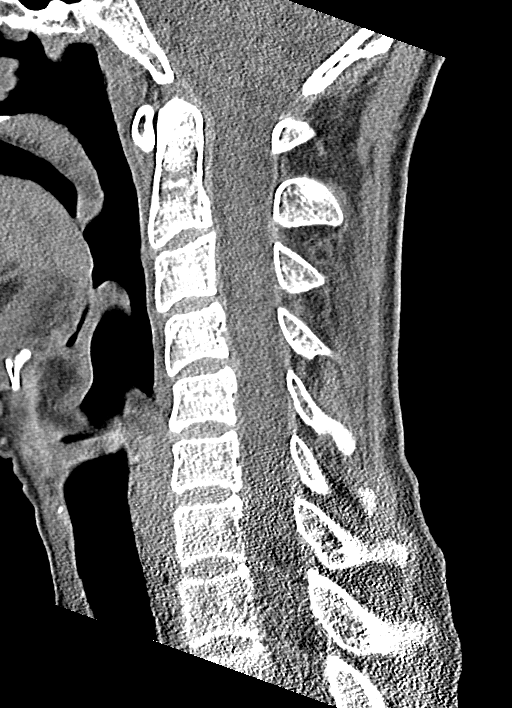
[im 28/55  bone]
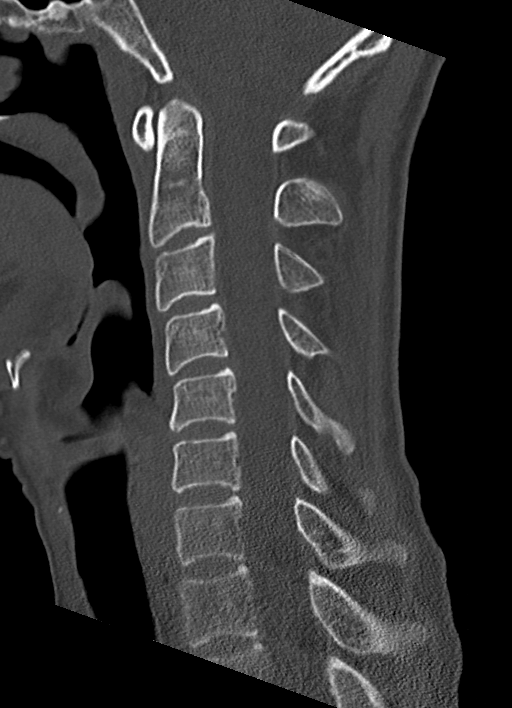
[im 32/55  bone]
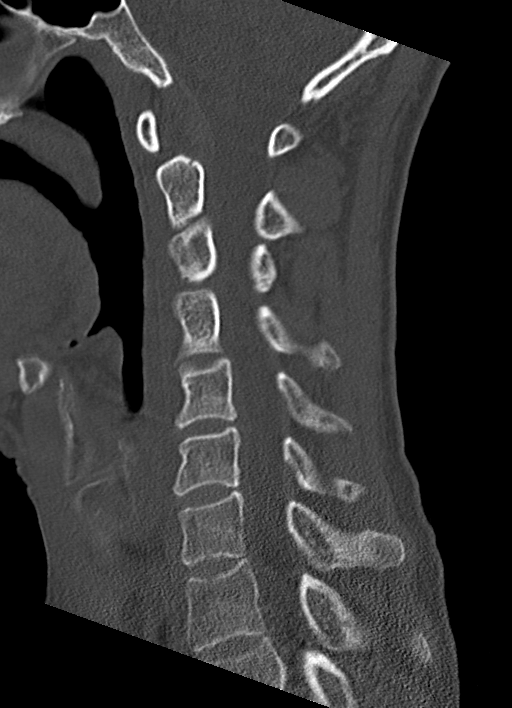
[im 37/55  bone]
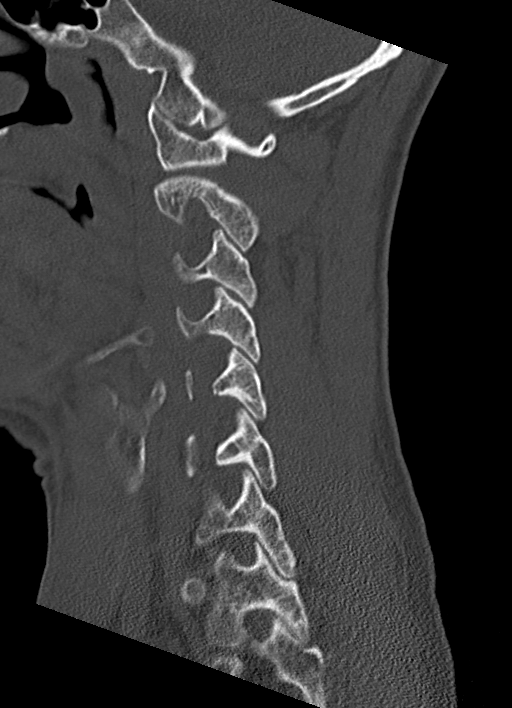

[Series 5: coronal bone · coronal · 0.23mm/px · 3 of 57 slices shown]
[im 12/57  bone]
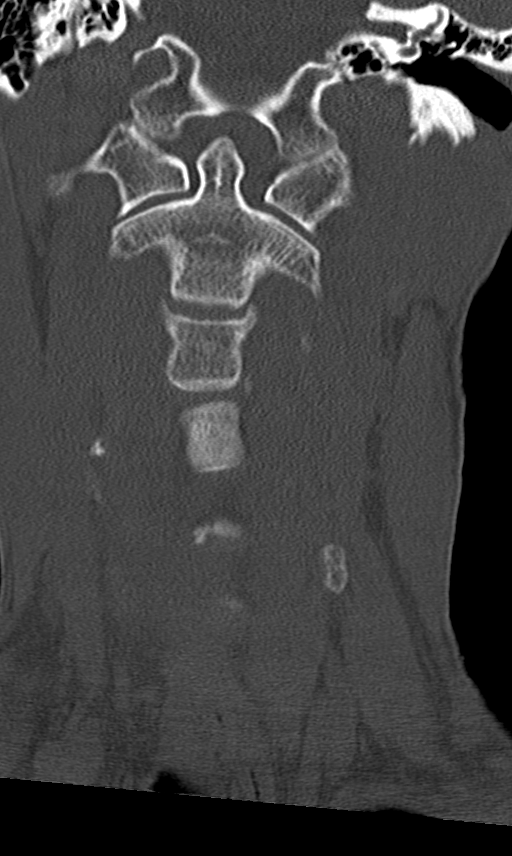
[im 23/57  bone]
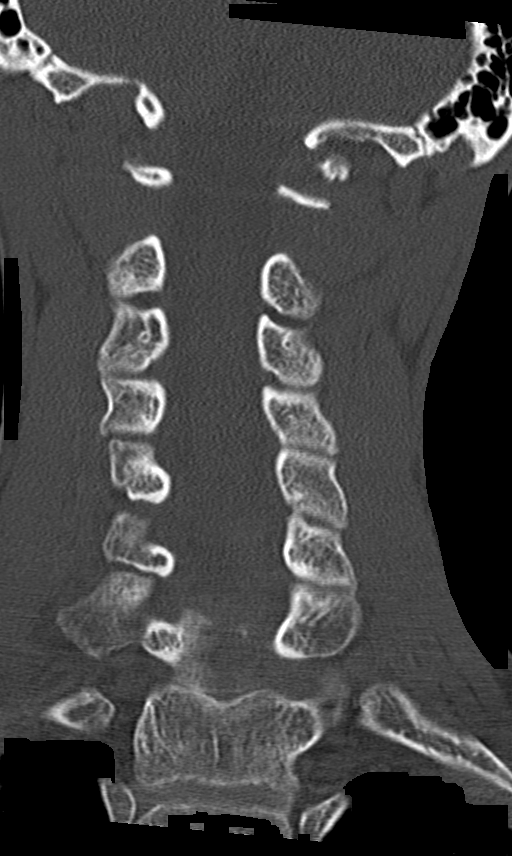
[im 34/57  bone]
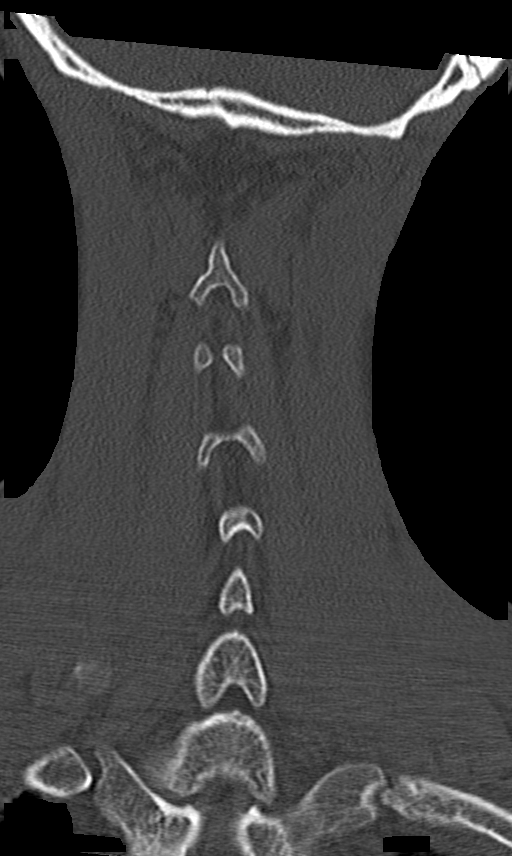

[Series 6: orthogonal bone · axial · 0.25mm/px · z∈[+150,+272]mm · 4 of 99 slices shown, 5 images]
[im 17/99  soft-tissue]
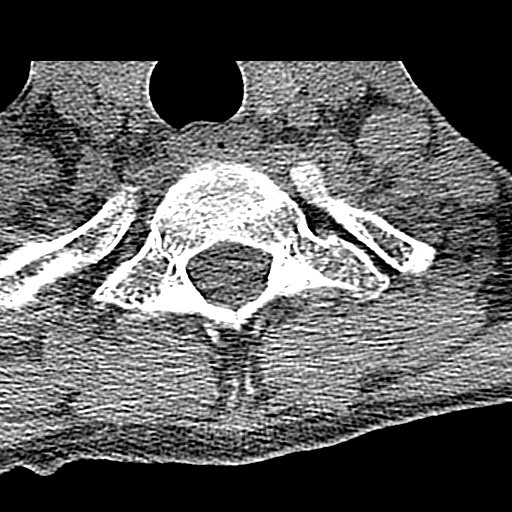
[im 17/99  bone]
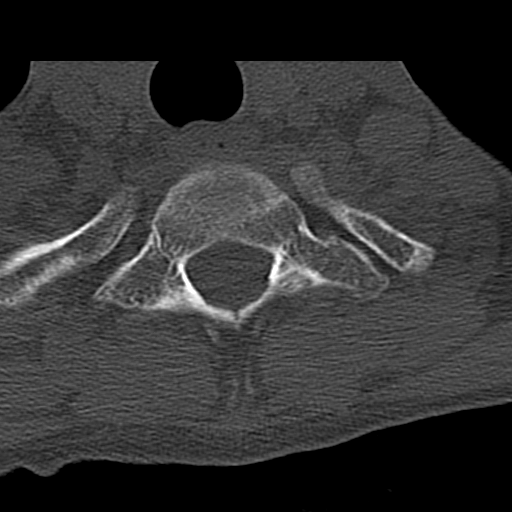
[im 33/99  bone]
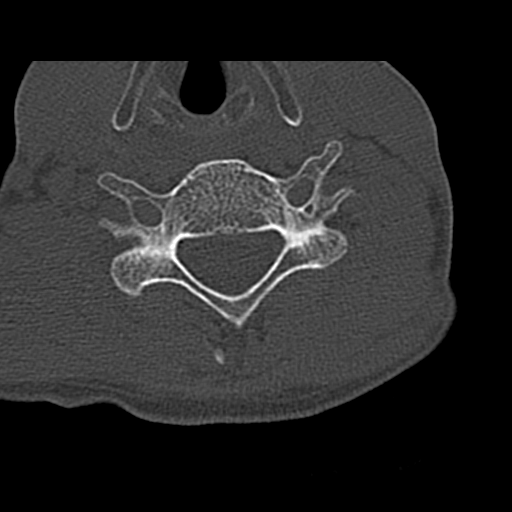
[im 66/99  bone]
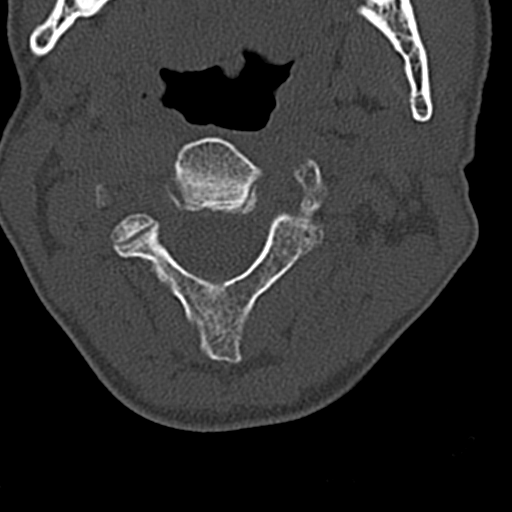
[im 82/99  bone]
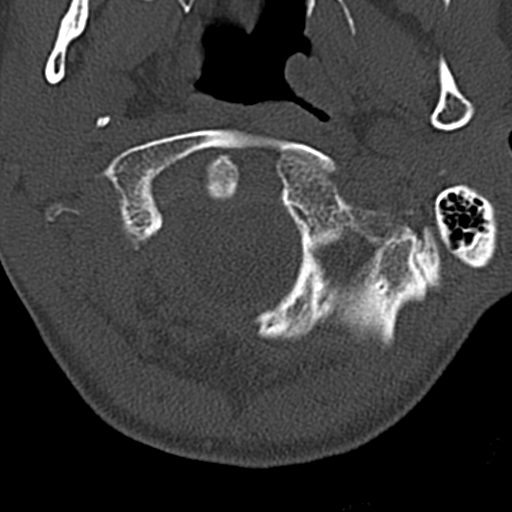

[12 of 33 positions shown; findings below may reference images not displayed]

FINDINGS: CT HEAD FINDINGS

Brain:

No evidence of large-territorial acute infarction. No parenchymal
hemorrhage. No mass lesion. No extra-axial collection.

No mass effect or midline shift. No hydrocephalus. Basilar cisterns
are patent.

Vascular: No hyperdense vessel.

Skull: No acute fracture or focal lesion.

Sinuses/Orbits: Paranasal sinuses and mastoid air cells are clear.
The orbits are unremarkable.

Other: None.

CT CERVICAL SPINE FINDINGS

Alignment: Straightening of the cervical lordosis.

Skull base and vertebrae: No acute fracture. No aggressive appearing
focal osseous lesion or focal pathologic process.

Soft tissues and spinal canal: No prevertebral fluid or swelling. No
visible canal hematoma.

Upper chest: Severe biapical pleural/pulmonary scarring.

Other: None.
IMPRESSION: 1. No acute intracranial abnormality.
2. No acute displaced fracture or traumatic listhesis of the
cervical spine.
